# Patient Record
Sex: Male | Born: 1955 | Race: Black or African American | Hispanic: No | Marital: Married | Smoking: Former smoker
Health system: Southern US, Community
[De-identification: ages and names within clinical notes are randomized; demographics above are authoritative.]

## PROBLEM LIST (undated history)

## (undated) DIAGNOSIS — R6882 Decreased libido: Secondary | ICD-10-CM

## (undated) DIAGNOSIS — N529 Male erectile dysfunction, unspecified: Secondary | ICD-10-CM

## (undated) DIAGNOSIS — R42 Dizziness and giddiness: Secondary | ICD-10-CM

## (undated) DIAGNOSIS — I10 Essential (primary) hypertension: Secondary | ICD-10-CM

## (undated) DIAGNOSIS — H8309 Labyrinthitis, unspecified ear: Secondary | ICD-10-CM

## (undated) DIAGNOSIS — E785 Hyperlipidemia, unspecified: Secondary | ICD-10-CM

## (undated) DIAGNOSIS — F4310 Post-traumatic stress disorder, unspecified: Secondary | ICD-10-CM

## (undated) DIAGNOSIS — Z8711 Personal history of peptic ulcer disease: Secondary | ICD-10-CM

## (undated) DIAGNOSIS — N400 Enlarged prostate without lower urinary tract symptoms: Secondary | ICD-10-CM

## (undated) DIAGNOSIS — M1990 Unspecified osteoarthritis, unspecified site: Secondary | ICD-10-CM

## (undated) DIAGNOSIS — R1013 Epigastric pain: Secondary | ICD-10-CM

## (undated) HISTORY — DX: Dizziness and giddiness: R42

## (undated) HISTORY — DX: Labyrinthitis, unspecified ear: H83.09

## (undated) HISTORY — DX: Epigastric pain: R10.13

## (undated) HISTORY — DX: Personal history of peptic ulcer disease: Z87.11

## (undated) HISTORY — DX: Hyperlipidemia, unspecified: E78.5

## (undated) HISTORY — PX: COLONOSCOPY: SHX174

## (undated) HISTORY — PX: TOTAL KNEE ARTHROPLASTY: SHX125

## (undated) HISTORY — DX: Essential (primary) hypertension: I10

## (undated) HISTORY — PX: CYSTECTOMY: SUR359

## (undated) HISTORY — DX: Decreased libido: R68.82

## (undated) HISTORY — PX: KNEE ARTHROSCOPY: SHX127

## (undated) HISTORY — PX: APPENDECTOMY: SHX54

---

## 1998-10-04 ENCOUNTER — Emergency Department (HOSPITAL_COMMUNITY): Admission: EM | Admit: 1998-10-04 | Discharge: 1998-10-04 | Payer: Self-pay | Admitting: *Deleted

## 2006-06-21 ENCOUNTER — Ambulatory Visit: Payer: Self-pay | Admitting: Internal Medicine

## 2006-06-21 LAB — CONVERTED CEMR LAB
Basophils Absolute: 0 10*3/uL (ref 0.0–0.1)
Basophils Relative: 0.5 % (ref 0.0–1.0)
Bilirubin, Direct: 0.1 mg/dL (ref 0.0–0.3)
CO2: 33 meq/L — ABNORMAL HIGH (ref 19–32)
Creatinine, Ser: 1.1 mg/dL (ref 0.4–1.5)
Direct LDL: 189.6 mg/dL
Eosinophils Relative: 2.2 % (ref 0.0–5.0)
Glucose, Bld: 104 mg/dL — ABNORMAL HIGH (ref 70–99)
HCT: 43.3 % (ref 39.0–52.0)
Hemoglobin: 14.8 g/dL (ref 13.0–17.0)
MCHC: 34.3 g/dL (ref 30.0–36.0)
Monocytes Absolute: 0.6 10*3/uL (ref 0.2–0.7)
Neutrophils Relative %: 60 % (ref 43.0–77.0)
PSA: 0.59 ng/mL (ref 0.10–4.00)
Potassium: 4.2 meq/L (ref 3.5–5.1)
RDW: 13.7 % (ref 11.5–14.6)
Sodium: 141 meq/L (ref 135–145)
Total Bilirubin: 0.8 mg/dL (ref 0.3–1.2)
Total CHOL/HDL Ratio: 6.7
Total Protein: 7.1 g/dL (ref 6.0–8.3)
WBC: 7.4 10*3/uL (ref 4.5–10.5)

## 2006-06-28 ENCOUNTER — Ambulatory Visit: Payer: Self-pay | Admitting: Internal Medicine

## 2006-09-28 ENCOUNTER — Ambulatory Visit: Payer: Self-pay | Admitting: Internal Medicine

## 2006-09-28 LAB — CONVERTED CEMR LAB
AST: 34 units/L (ref 0–37)
Albumin: 3.9 g/dL (ref 3.5–5.2)
Alkaline Phosphatase: 98 units/L (ref 39–117)
Total Bilirubin: 1 mg/dL (ref 0.3–1.2)
VLDL: 17 mg/dL (ref 0–40)

## 2007-06-11 DIAGNOSIS — E785 Hyperlipidemia, unspecified: Secondary | ICD-10-CM

## 2007-06-11 DIAGNOSIS — I10 Essential (primary) hypertension: Secondary | ICD-10-CM

## 2007-06-11 HISTORY — DX: Hyperlipidemia, unspecified: E78.5

## 2007-06-11 HISTORY — DX: Essential (primary) hypertension: I10

## 2007-08-27 ENCOUNTER — Emergency Department (HOSPITAL_COMMUNITY): Admission: EM | Admit: 2007-08-27 | Discharge: 2007-08-27 | Payer: Self-pay | Admitting: Emergency Medicine

## 2007-08-29 ENCOUNTER — Ambulatory Visit: Payer: Self-pay | Admitting: Internal Medicine

## 2007-08-29 DIAGNOSIS — R6882 Decreased libido: Secondary | ICD-10-CM

## 2007-08-29 DIAGNOSIS — L723 Sebaceous cyst: Secondary | ICD-10-CM | POA: Insufficient documentation

## 2007-08-29 HISTORY — DX: Decreased libido: R68.82

## 2007-11-06 ENCOUNTER — Ambulatory Visit: Payer: Self-pay | Admitting: Internal Medicine

## 2007-11-06 LAB — CONVERTED CEMR LAB
Bilirubin, Direct: 0.1 mg/dL (ref 0.0–0.3)
Direct LDL: 184.4 mg/dL
HDL: 43.2 mg/dL (ref >39.0)
Total Bilirubin: 0.8 mg/dL (ref 0.3–1.2)
Total Protein: 6.9 g/dL (ref 6.0–8.3)
Triglycerides: 64 mg/dL (ref 0–149)
VLDL: 13 mg/dL (ref 0–40)

## 2007-11-19 ENCOUNTER — Ambulatory Visit: Payer: Self-pay | Admitting: Gastroenterology

## 2007-11-29 ENCOUNTER — Ambulatory Visit: Payer: Self-pay | Admitting: Internal Medicine

## 2007-11-29 LAB — CONVERTED CEMR LAB
Cholesterol, target level: 200 mg/dL
LDL Goal: 130 mg/dL

## 2007-12-03 ENCOUNTER — Ambulatory Visit: Payer: Self-pay | Admitting: Gastroenterology

## 2008-02-09 ENCOUNTER — Ambulatory Visit: Payer: Self-pay | Admitting: Cardiovascular Disease

## 2008-02-10 ENCOUNTER — Ambulatory Visit: Payer: Self-pay | Admitting: Internal Medicine

## 2008-02-10 ENCOUNTER — Ambulatory Visit: Payer: Self-pay | Admitting: Cardiology

## 2008-02-10 ENCOUNTER — Observation Stay (HOSPITAL_COMMUNITY): Admission: EM | Admit: 2008-02-10 | Discharge: 2008-02-11 | Payer: Self-pay | Admitting: Emergency Medicine

## 2008-02-11 ENCOUNTER — Encounter: Payer: Self-pay | Admitting: Internal Medicine

## 2008-02-12 ENCOUNTER — Ambulatory Visit: Payer: Self-pay

## 2008-02-12 ENCOUNTER — Encounter: Payer: Self-pay | Admitting: Internal Medicine

## 2008-02-13 ENCOUNTER — Telehealth: Payer: Self-pay | Admitting: *Deleted

## 2008-02-15 ENCOUNTER — Telehealth: Payer: Self-pay | Admitting: Internal Medicine

## 2008-02-15 ENCOUNTER — Ambulatory Visit: Payer: Self-pay | Admitting: Internal Medicine

## 2008-02-15 DIAGNOSIS — H8309 Labyrinthitis, unspecified ear: Secondary | ICD-10-CM | POA: Insufficient documentation

## 2008-02-15 DIAGNOSIS — R1013 Epigastric pain: Secondary | ICD-10-CM | POA: Insufficient documentation

## 2008-02-15 HISTORY — DX: Labyrinthitis, unspecified ear: H83.09

## 2008-02-15 HISTORY — DX: Epigastric pain: R10.13

## 2008-02-15 LAB — CONVERTED CEMR LAB
Basophils Absolute: 0 10*3/uL (ref 0.0–0.1)
Basophils Relative: 0.4 % (ref 0.0–3.0)
Calcium: 9.7 mg/dL (ref 8.4–10.5)
Eosinophils Absolute: 0 10*3/uL (ref 0.0–0.7)
Folate: 15.4 ng/mL
GFR calc Af Amer: 91 mL/min
GFR calc non Af Amer: 75 mL/min
Glucose, Bld: 96 mg/dL (ref 70–99)
Iron: 47 ug/dL (ref 42–165)
Lymphocytes Relative: 19.4 % (ref 12.0–46.0)
MCHC: 34.7 g/dL (ref 30.0–36.0)
MCV: 78.1 fL (ref 78.0–100.0)
Neutrophils Relative %: 72.4 % (ref 43.0–77.0)
Platelets: 198 10*3/uL (ref 150–400)
Potassium: 4.4 meq/L (ref 3.5–5.1)
RBC: 6.03 M/uL — ABNORMAL HIGH (ref 4.22–5.81)
Saturation Ratios: 14.8 % — ABNORMAL LOW (ref 20.0–50.0)
Sodium: 141 meq/L (ref 135–145)
Transferrin: 227.2 mg/dL (ref >=212.0)
Vitamin B-12: 743 pg/mL (ref 211–911)
WBC: 10.6 10*3/uL — ABNORMAL HIGH (ref 4.5–10.5)

## 2008-02-21 ENCOUNTER — Encounter: Payer: Self-pay | Admitting: Internal Medicine

## 2008-03-12 ENCOUNTER — Ambulatory Visit: Payer: Self-pay | Admitting: Internal Medicine

## 2008-03-12 LAB — CONVERTED CEMR LAB
ALT: 47 units/L (ref 0–53)
Albumin: 3.8 g/dL (ref 3.5–5.2)
Alkaline Phosphatase: 88 units/L (ref 39–117)
BUN: 10 mg/dL (ref 6–23)
Bilirubin, Direct: 0.1 mg/dL (ref 0.0–0.3)
CO2: 31 meq/L (ref 19–32)
Calcium: 9.1 mg/dL (ref 8.4–10.5)
Eosinophils Relative: 1 % (ref 0.0–5.0)
Glucose, Bld: 107 mg/dL — ABNORMAL HIGH (ref 70–99)
HCT: 42.9 % (ref 39.0–52.0)
Hemoglobin: 14.2 g/dL (ref 13.0–17.0)
Ketones, urine, test strip: NEGATIVE
LDL Cholesterol: 112 mg/dL — ABNORMAL HIGH (ref 0–99)
Lymphocytes Relative: 25.7 % (ref 12.0–46.0)
Monocytes Relative: 9.1 % (ref 3.0–12.0)
Neutro Abs: 4.4 10*3/uL (ref 1.4–7.7)
Nitrite: NEGATIVE
Potassium: 4.2 meq/L (ref 3.5–5.1)
RDW: 13.7 % (ref 11.5–14.6)
Sodium: 143 meq/L (ref 135–145)
Specific Gravity, Urine: 1.02
Total CHOL/HDL Ratio: 5.2
Total Protein: 7.1 g/dL (ref 6.0–8.3)
Triglycerides: 84 mg/dL (ref 0–149)
Urobilinogen, UA: 0.2
WBC Urine, dipstick: NEGATIVE
WBC: 6.9 10*3/uL (ref 4.5–10.5)

## 2008-03-19 ENCOUNTER — Ambulatory Visit: Payer: Self-pay | Admitting: Internal Medicine

## 2008-03-31 ENCOUNTER — Telehealth: Payer: Self-pay | Admitting: Family Medicine

## 2008-04-01 ENCOUNTER — Ambulatory Visit: Payer: Self-pay | Admitting: Family Medicine

## 2008-04-01 DIAGNOSIS — Z8711 Personal history of peptic ulcer disease: Secondary | ICD-10-CM

## 2008-04-01 HISTORY — DX: Personal history of peptic ulcer disease: Z87.11

## 2008-04-15 ENCOUNTER — Telehealth: Payer: Self-pay | Admitting: Internal Medicine

## 2008-05-28 ENCOUNTER — Telehealth: Payer: Self-pay | Admitting: Internal Medicine

## 2008-06-12 ENCOUNTER — Ambulatory Visit: Payer: Self-pay | Admitting: Internal Medicine

## 2008-06-25 ENCOUNTER — Ambulatory Visit: Payer: Self-pay | Admitting: Internal Medicine

## 2008-06-25 ENCOUNTER — Telehealth: Payer: Self-pay | Admitting: Internal Medicine

## 2008-06-26 ENCOUNTER — Telehealth: Payer: Self-pay | Admitting: Internal Medicine

## 2008-06-27 ENCOUNTER — Telehealth: Payer: Self-pay | Admitting: Internal Medicine

## 2008-08-08 ENCOUNTER — Ambulatory Visit: Payer: Self-pay | Admitting: Internal Medicine

## 2008-08-08 LAB — CONVERTED CEMR LAB
Bilirubin, Direct: 0.1 mg/dL (ref 0.0–0.3)
LDL Cholesterol: 108 mg/dL — ABNORMAL HIGH (ref 0–99)
Total Bilirubin: 1 mg/dL (ref 0.3–1.2)
VLDL: 16.8 mg/dL (ref 0.0–40.0)

## 2008-08-15 ENCOUNTER — Ambulatory Visit: Payer: Self-pay | Admitting: Internal Medicine

## 2008-08-18 ENCOUNTER — Telehealth: Payer: Self-pay | Admitting: Internal Medicine

## 2008-08-19 ENCOUNTER — Ambulatory Visit: Payer: Self-pay | Admitting: Internal Medicine

## 2008-08-19 DIAGNOSIS — R42 Dizziness and giddiness: Secondary | ICD-10-CM

## 2008-08-19 HISTORY — DX: Dizziness and giddiness: R42

## 2008-08-19 LAB — CONVERTED CEMR LAB
BUN: 13 mg/dL (ref 6–23)
CO2: 30 meq/L (ref 19–32)
Chloride: 108 meq/L (ref 96–112)
Eosinophils Absolute: 0 10*3/uL (ref 0.0–0.7)
Eosinophils Relative: 0.7 % (ref 0.0–5.0)
Glucose, Bld: 93 mg/dL (ref 70–99)
HCT: 45.3 % (ref 39.0–52.0)
Lymphs Abs: 1.7 10*3/uL (ref 0.7–4.0)
MCHC: 33.8 g/dL (ref 30.0–36.0)
MCV: 76.7 fL — ABNORMAL LOW (ref 78.0–100.0)
Monocytes Absolute: 0.6 10*3/uL (ref 0.1–1.0)
Platelets: 149 10*3/uL — ABNORMAL LOW (ref 150.0–400.0)
Potassium: 3.9 meq/L (ref 3.5–5.1)
RDW: 14.3 % (ref 11.5–14.6)
WBC: 6.8 10*3/uL (ref 4.5–10.5)

## 2008-08-20 ENCOUNTER — Encounter: Admission: RE | Admit: 2008-08-20 | Discharge: 2008-08-20 | Payer: Self-pay | Admitting: Internal Medicine

## 2008-08-25 ENCOUNTER — Telehealth: Payer: Self-pay | Admitting: Internal Medicine

## 2008-09-09 ENCOUNTER — Encounter: Payer: Self-pay | Admitting: Internal Medicine

## 2008-09-09 ENCOUNTER — Telehealth: Payer: Self-pay | Admitting: Internal Medicine

## 2008-10-10 ENCOUNTER — Ambulatory Visit: Payer: Self-pay | Admitting: Internal Medicine

## 2010-02-24 ENCOUNTER — Ambulatory Visit: Payer: Self-pay | Admitting: Family Medicine

## 2010-04-29 ENCOUNTER — Encounter: Payer: Self-pay | Admitting: *Deleted

## 2010-05-06 NOTE — Assessment & Plan Note (Signed)
Summary: congestion//ccm (dr Lovell Sheehan' pt)   Vital Signs:  Patient profile:   55 year old male Weight:      237 pounds Temp:     98.2 degrees F oral BP sitting:   112 / 74  (right arm) Cuff size:   regular  Vitals Entered By: Duard Brady LPN (February 24, 2010 3:09 PM) CC: C/O CHEST CONGESTION - OCC SLIGHTLY PRODUCTIVE COUGH Is Patient Diabetic? No   History of Present Illness: Patient is a nonsmoker seen with approximately one-week history of cough which is mostly dry. Occasionally productive of yellow sputum. No chronic lung disease. No fever. Minimal nasal congestive symptoms. Cough more bothersome at night. Not relieved with over-the-counter cough medicine. No appetite or weight changes. No hemoptysis. No pleuritic pain.  Allergies (verified): No Known Drug Allergies  Past History:  Past Medical History: Last updated: 11/06/2007 Hyperlipidemia Hypertension ED  Physical Exam  General:  Well-developed,well-nourished,in no acute distress; alert,appropriate and cooperative throughout examination Ears:  External ear exam shows no significant lesions or deformities.  Otoscopic examination reveals clear canals, tympanic membranes are intact bilaterally without bulging, retraction, inflammation or discharge. Hearing is grossly normal bilaterally. Mouth:  Oral mucosa and oropharynx without lesions or exudates.  Teeth in good repair. Neck:  No deformities, masses, or tenderness noted. Lungs:  Normal respiratory effort, chest expands symmetrically. Lungs are clear to auscultation, no crackles or wheezes. Heart:  normal rate, regular rhythm, and no murmur.     Impression & Recommendations:  Problem # 1:  ACUTE BRONCHITIS (ICD-466.0)  Suspect viral. Cough suppressant prescribed. No indication for antibiotics at this time.  His updated medication list for this problem includes:    Hydrocodone-homatropine 5-1.5 Mg/32ml Syrp (Hydrocodone-homatropine) ..... One tsp by mouth q  4-6 hours as needed cough  Complete Medication List: 1)  Multivitamins Caps (Multiple vitamin) .... Once daily 2)  Adult Aspirin Ec Low Strength 81 Mg Tbec (Aspirin) .... Once daily 3)  Antivert 25 Mg Tabs (Meclizine hcl) .... One by mouth three times a day as needed for dizzyness 4)  Cvs Omeprazole 20 Mg Tbec (Omeprazole) .Marland Kitchen.. 1 once daily 5)  Crestor 10 Mg Tabs (Rosuvastatin calcium) .... One by mouth daily 6)  Exforge Hct 10-320-25 Mg Tabs (Amlodipine-valsartan-hctz) .... 1/2 by mouth daily 7)  Hydrocodone-homatropine 5-1.5 Mg/70ml Syrp (Hydrocodone-homatropine) .... One tsp by mouth q 4-6 hours as needed cough  Patient Instructions: 1)  Acute Bronchitis symptoms for less then 10 days are not  helped by antibiotics. Take over the counter cough medications. Call if no improvement in 5-7 days, sooner if increasing cough, fever, or new symptoms ( shortness of breath, chest pain) .  Prescriptions: HYDROCODONE-HOMATROPINE 5-1.5 MG/5ML SYRP (HYDROCODONE-HOMATROPINE) one tsp by mouth q 4-6 hours as needed cough  #120 ml x 0   Entered and Authorized by:   Evelena Peat MD   Signed by:   Evelena Peat MD on 02/24/2010   Method used:   Print then Give to Patient   RxID:   [id number]    Orders Added: 1)  Est. Patient Level III [42706]

## 2010-05-06 NOTE — Progress Notes (Signed)
Summary: elevated BP  Phone Note Call from Patient   Caller: Patient Call For: Stacie Glaze MD Summary of Call: Pt is taking Benicar 20 mg one by mouth daily, but BP has increased to 145/101.  What do you want him to do? [phone] Initial call taken by: Lynann Beaver CMA,  June 25, 2008 9:43 AM  Follow-up for Phone Call        dr Lovell Sheehan to see after pt walked ine Follow-up by: Willy Eddy, LPN,  June 25, 2008 11:21 AM

## 2010-05-10 ENCOUNTER — Encounter: Payer: Self-pay | Admitting: Internal Medicine

## 2010-05-10 ENCOUNTER — Ambulatory Visit (INDEPENDENT_AMBULATORY_CARE_PROVIDER_SITE_OTHER): Payer: BC Managed Care – PPO | Admitting: Internal Medicine

## 2010-05-10 DIAGNOSIS — Z8711 Personal history of peptic ulcer disease: Secondary | ICD-10-CM

## 2010-05-10 DIAGNOSIS — K219 Gastro-esophageal reflux disease without esophagitis: Secondary | ICD-10-CM | POA: Insufficient documentation

## 2010-05-10 DIAGNOSIS — E785 Hyperlipidemia, unspecified: Secondary | ICD-10-CM

## 2010-05-10 DIAGNOSIS — R6882 Decreased libido: Secondary | ICD-10-CM

## 2010-05-10 MED ORDER — ESOMEPRAZOLE MAGNESIUM 40 MG PO CPDR: 40.0000 mg | DELAYED_RELEASE_CAPSULE | Freq: Every day | ORAL | Status: DC

## 2010-05-10 NOTE — Progress Notes (Signed)
  Subjective:    Patient ID: Adam Randolph, male    DOB: 08-23-1955, 55 y.o.   MRN: [id number]  Cough This is a chronic problem. The current episode started more than 1 month ago. The problem has been gradually worsening. The problem occurs every few hours. The cough is non-productive. Associated symptoms include chills, ear congestion, ear pain, a fever, heartburn, myalgias, nasal congestion, postnasal drip, a rash, rhinorrhea, a sore throat, weight loss and wheezing. Pertinent negatives include no chest pain, headaches, shortness of breath or sweats. The symptoms are aggravated by lying down. He has tried nothing for the symptoms. There is no history of asthma, bronchiectasis, bronchitis, COPD, emphysema, environmental allergies or pneumonia.  Hypertension This is a chronic problem. The current episode started more than 1 year ago. The problem has been resolved since onset. The problem is controlled. Pertinent negatives include no chest pain, headaches, shortness of breath or sweats.  Hyperlipidemia This is a chronic problem. The current episode started more than 1 year ago. The problem is uncontrolled. Recent lipid tests were reviewed and are high. Associated symptoms include myalgias. Pertinent negatives include no chest pain or shortness of breath. He is currently on no antihyperlipidemic treatment. The current treatment provides no improvement of lipids. Compliance problems include adherence to diet and adherence to exercise.  Risk factors for coronary artery disease include male sex, dyslipidemia and family history.      Review of Systems  Constitutional: Positive for fever, chills and weight loss.  HENT: Positive for ear pain, sore throat, rhinorrhea and postnasal drip.   Respiratory: Positive for cough and wheezing. Negative for shortness of breath.   Cardiovascular: Negative for chest pain.  Gastrointestinal: Positive for heartburn.  Musculoskeletal: Positive for myalgias.  Skin:  Positive for rash.  Neurological: Negative for headaches.  Hematological: Negative for environmental allergies.       Objective:   Physical Exam  Constitutional: He is oriented to person, place, and time. He appears well-developed and well-nourished.  HENT:  Head: Normocephalic and atraumatic.  Right Ear: External ear normal.  Left Ear: External ear normal.  Eyes: EOM are normal. Pupils are equal, round, and reactive to light.  Neck: Normal range of motion. Neck supple.  Cardiovascular: Normal rate and regular rhythm.   Pulmonary/Chest: Effort normal and breath sounds normal.  Abdominal: Soft. Bowel sounds are normal.  Musculoskeletal: Normal range of motion.  Neurological: He is alert and oriented to person, place, and time.  Skin: Skin is warm and dry.        The patient's past medical history social history family history medication list and problem list reviewed as well as the patient's allergies   Assessment & Plan:   one. Chronic cough the patient's chronic cough is most likely due to GERD although other etiologies to consider maybe allergies or perhaps mild asthma. His risk factors for these disease however are much less than his known history of GERD and ulcer disease.  Therefore we will begin Nexium 40 mg one by mouth daily as a trial until his next visit.   2. Hypertension his hypertension has resolved with weight loss he is off medications is normal tensive encouraged him to continue weight loss and exercise.    3. Hyperlipidemia the patient is noncompliant with his medications we will schedule a complete physical examination and lipid monitoring to see how weight loss is impacted his hyperlipidemia.   we will plan to see this patient back for complete physical examination

## 2010-05-10 NOTE — Patient Instructions (Signed)
Arrick, be sure to take the Nexium on a daily basis a prescription has been called into the [facility].  Please schedule complete physical examination

## 2010-05-13 ENCOUNTER — Ambulatory Visit (INDEPENDENT_AMBULATORY_CARE_PROVIDER_SITE_OTHER): Payer: BC Managed Care – PPO | Admitting: Internal Medicine

## 2010-05-13 ENCOUNTER — Encounter: Payer: Self-pay | Admitting: Internal Medicine

## 2010-05-13 VITALS — BP 160/100 | HR 68 | Temp 98.1°F | Resp 14 | Ht 72.0 in | Wt 234.0 lb

## 2010-05-13 DIAGNOSIS — E785 Hyperlipidemia, unspecified: Secondary | ICD-10-CM

## 2010-05-13 DIAGNOSIS — I10 Essential (primary) hypertension: Secondary | ICD-10-CM

## 2010-05-13 DIAGNOSIS — Z0000 Encounter for general adult medical examination without abnormal findings: Secondary | ICD-10-CM

## 2010-05-13 LAB — POCT URINALYSIS DIPSTICK
Leukocytes, UA: NEGATIVE
Nitrite, UA: NEGATIVE
Protein, UA: NEGATIVE
pH, UA: 5

## 2010-05-13 LAB — CBC WITH DIFFERENTIAL/PLATELET
Eosinophils Absolute: 0.1 10*3/uL (ref 0.0–0.7)
Eosinophils Relative: 1.1 % (ref 0.0–5.0)
MCV: 78.7 fl (ref 78.0–100.0)
Monocytes Absolute: 0.7 10*3/uL (ref 0.1–1.0)
Neutrophils Relative %: 57.5 % (ref 43.0–77.0)
Platelets: 159 10*3/uL (ref 150.0–400.0)
WBC: 8.2 10*3/uL (ref 4.5–10.5)

## 2010-05-13 LAB — LIPID PANEL
Cholesterol: 241 mg/dL — ABNORMAL HIGH (ref 0–200)
HDL: 42.9 mg/dL (ref >39.00)
VLDL: 18.6 mg/dL (ref 0.0–40.0)

## 2010-05-13 LAB — BASIC METABOLIC PANEL
GFR: 100.5 mL/min (ref >60.00)
Glucose, Bld: 78 mg/dL (ref 70–99)
Potassium: 3.7 mEq/L (ref 3.5–5.1)
Sodium: 134 mEq/L — ABNORMAL LOW (ref 135–145)

## 2010-05-13 LAB — PSA: PSA: 0.61 ng/mL (ref 0.10–4.00)

## 2010-05-13 LAB — HEPATIC FUNCTION PANEL
Albumin: 3.9 g/dL (ref 3.5–5.2)
Total Protein: 7.1 g/dL (ref 6.0–8.3)

## 2010-05-13 LAB — TSH: TSH: 1.52 u[IU]/mL (ref 0.35–5.50)

## 2010-05-13 MED ORDER — OLMESARTAN MEDOXOMIL 20 MG PO TABS: 20.0000 mg | ORAL_TABLET | Freq: Every day | ORAL | Status: DC

## 2010-05-13 NOTE — Progress Notes (Signed)
Subjective:    Patient ID: Adam Randolph, male    DOB: [birth year], 55 y.o.   MRN: [id number]  HPI patient presents for complete physical examination in followup of elevated blood pressure he was previously diagnosed with hypertension but discontinued his medications do to attempt on his part to lose weight and modify salt intake.  He has done this but his blood pressure remains elevated I suspect he is very salt sensitive and that yesterday he had an order of french fries.   He did not have any chest pain shortness of breath or any cardiovascular warning signs he is an [place] male with elevated lipids and age greater than 50 and these risk factors alone would warrant intervention and his blood pressure.      Review of Systems  Constitutional: Negative for fever and fatigue.  HENT: Negative for hearing loss, congestion, neck pain and postnasal drip.   Eyes: Negative for discharge, redness and visual disturbance.  Respiratory: Negative for cough, shortness of breath and wheezing.   Cardiovascular: Negative for leg swelling.  Gastrointestinal: Negative for abdominal pain, constipation and abdominal distention.  Genitourinary: Negative for urgency and frequency.  Musculoskeletal: Negative for joint swelling and arthralgias.  Skin: Negative for color change and rash.  Neurological: Negative for weakness and light-headedness.  Hematological: Negative for adenopathy.  Psychiatric/Behavioral: Negative for behavioral problems.       Objective:   Physical Exam  Constitutional: He is oriented to person, place, and time. He appears well-developed and well-nourished.        Moderately overweight African American male in no apparent distress  HENT:  Head: Normocephalic and atraumatic.  Eyes: Conjunctivae are normal. Pupils are equal, round, and reactive to light.  Neck: Normal range of motion. Neck supple.  Cardiovascular: Normal rate and regular rhythm.   Pulmonary/Chest:  Effort normal and breath sounds normal.  Abdominal: Soft. Bowel sounds are normal.  Genitourinary: Rectum normal.         prostate was +2 in size with normal architecture and consistency  Musculoskeletal: Normal range of motion. He exhibits no edema and no tenderness.  Neurological: He is oriented to person, place, and time. He has normal reflexes. He displays normal reflexes. No cranial nerve deficit. He exhibits normal muscle tone. Coordination normal.  Skin: Skin is warm and dry.  Psychiatric: He has a normal mood and affect. Judgment and thought content normal.          Assessment & Plan:  1. Normal physical examination for this pleasant 55 year old white male screening labs were drawn today to include a PSA and TSH the basic metabolic panel liver panel and a CBC with differential we will review these with this patient after the results are obtained.  He has a history of labile hypertension he has attempted a diet and exercise program which has resulted in some weight loss but his blood pressure remains elevated.   We reviewed diet and exercise medication compliance health maintenance issues with this patient. 2. Essential hypertension the patient's hypertension has not been able to be treated with diet and exercise despite his best efforts he remains with a labile blood pressure his blood pressure today was 160/100 in both arms we discussed resuming the medication Benicar 20 mg by mouth daily discussed the side effects of the medications and the warning signs should he not treat his hypertension given his risk factors of age male African American hyperlipidemia and now hypertension.   he Will agreed to continue to  take this medication samples of the medication were given and a prescription for Benicar 20 mg by mouth daily was sent to his pharmacy diet and exercise were again reinforced with this patient.  Hyperlipidemia.  Monitoring laboratory values will be obtained today to see if his  weight loss and dietary changes have resulted in controlling his lipids without medication given his advanced risk factors would start him on a statin if his cholesterol is above goal

## 2010-05-13 NOTE — Patient Instructions (Signed)
Stay on the Benicar 20 mg daily for your blood pressure. It is important that she stay on this medication to protect your heart and eyes and kidneys from the effects of elevated blood pressure even when you're under stress you were given samples of this medication and a prescription has been called in to your [facility] pharmacy

## 2010-05-13 NOTE — Assessment & Plan Note (Signed)
The patient has tried to control his blood pressure to diet exercise but he still has an elevated blood pressure that requires treatment especially considering his risk factors of being a 55 year old [place] American male with hyperlipidemia.

## 2010-08-12 ENCOUNTER — Encounter: Payer: Self-pay | Admitting: Internal Medicine

## 2010-08-12 ENCOUNTER — Ambulatory Visit (INDEPENDENT_AMBULATORY_CARE_PROVIDER_SITE_OTHER): Payer: BC Managed Care – PPO | Admitting: Internal Medicine

## 2010-08-12 VITALS — BP 142/80 | HR 68 | Temp 98.2°F | Resp 16 | Ht 70.0 in | Wt 240.0 lb

## 2010-08-12 DIAGNOSIS — K219 Gastro-esophageal reflux disease without esophagitis: Secondary | ICD-10-CM

## 2010-08-12 DIAGNOSIS — E785 Hyperlipidemia, unspecified: Secondary | ICD-10-CM

## 2010-08-12 DIAGNOSIS — I10 Essential (primary) hypertension: Secondary | ICD-10-CM

## 2010-08-12 DIAGNOSIS — N529 Male erectile dysfunction, unspecified: Secondary | ICD-10-CM

## 2010-08-12 LAB — LIPID PANEL
Cholesterol: 220 mg/dL — ABNORMAL HIGH (ref 0–200)
Total CHOL/HDL Ratio: 6

## 2010-08-12 LAB — BASIC METABOLIC PANEL
BUN: 16 mg/dL (ref 6–23)
Creatinine, Ser: 1.2 mg/dL (ref 0.4–1.5)
GFR: 85.45 mL/min (ref >60.00)

## 2010-08-12 MED ORDER — TADALAFIL 10 MG PO TABS: 10.0000 mg | ORAL_TABLET | ORAL | Status: DC | PRN

## 2010-08-12 NOTE — Progress Notes (Signed)
  Subjective:    Patient ID: Adam Randolph, male    DOB: 01/14/1956, 55 y.o.   MRN: [id number]  HPI Patient is a 55 year old African American male with hypertension and a history of chest pain felt to be secondary to gastroesophageal reflux he has had no recent chest pain and has been doing well he states that he has noticed some low blood pressures at which time he skipped his blood pressure medications but today his blood pressure is elevated at 142/80 after 15 minutes of rest initially it was elevated at 150/100.     Review of Systems  Constitutional: Negative for fever and fatigue.  HENT: Negative for hearing loss, congestion, neck pain and postnasal drip.   Eyes: Negative for discharge, redness and visual disturbance.  Respiratory: Negative for cough, shortness of breath and wheezing.   Cardiovascular: Negative for leg swelling.  Gastrointestinal: Negative for abdominal pain, constipation and abdominal distention.  Genitourinary: Negative for urgency and frequency.  Musculoskeletal: Negative for joint swelling and arthralgias.  Skin: Negative for color change and rash.  Neurological: Negative for weakness and light-headedness.  Hematological: Negative for adenopathy.  Psychiatric/Behavioral: Negative for behavioral problems.   Past Medical History  Diagnosis Date  . HYPERLIPIDEMIA 06/11/2007  . UNSPECIFIED LABYRINTHITIS 02/15/2008  . HYPERTENSION 06/11/2007  . DIZZINESS, CHRONIC 08/19/2008  . Abdominal pain, epigastric 02/15/2008  . Decreased libido 08/29/2007  . DUODENAL ULCER, HX OF 04/01/2008   Past Surgical History  Procedure Date  . Appendectomy   . Knee arthroscopy     right knee    reports that he has never smoked. He does not have any smokeless tobacco history on file. He reports that he does not drink alcohol or use illicit drugs. family history includes Cancer in his father and Hypertension in his father. No Known Allergies      Objective:   Physical Exam    Constitutional: He appears well-developed and well-nourished.  HENT:  Head: Normocephalic and atraumatic.  Eyes: Conjunctivae are normal. Pupils are equal, round, and reactive to light.  Neck: Normal range of motion. Neck supple.  Cardiovascular: Normal rate and regular rhythm.   Pulmonary/Chest: Effort normal and breath sounds normal.  Abdominal: Soft. Bowel sounds are normal.          Assessment & Plan:  Patient has hypertension well controlled we take him back on a regular basis compliance was urged and a 30 minute face-to-face discussion of hypertensive control and the essential nature of blood pressure medicines.  He will take his medicine on a daily basis and not skip pills.  His gastroesophageal reflux is well controlled he has no ulcer symptoms he is not on a PPI at this time.  His cholesterol requires monitoring to see if he needs any intervention at this time risk factors include age [place] male and hypertension

## 2010-08-17 NOTE — Consult Note (Signed)
NAME:  Adam Randolph, Adam Randolph NO.:  [id number]   MEDICAL RECORD NO.:  [id number]          PATIENT TYPE:  INP   LOCATION:  3731                         FACILITY:  [facility]   PHYSICIAN:  Adam Rotunda, MD, FACCDATE OF BIRTH:  1955/04/09   DATE OF CONSULTATION:  02/11/2008  DATE OF DISCHARGE:                                 CONSULTATION   PRIMARY CARE PHYSICIAN:  Dr. Lovell Sheehan.   CHIEF COMPLAINT:  Chest pain.   HISTORY OF PRESENT ILLNESS:  Adam Randolph is a 55 year old male with no  previous history of coronary artery disease.  Last week he had two  episodes of lightheadedness that was orthostatic in nature by  description.  Two days ago, he was walking in [place] when he had  sudden onset of a lightheaded feeling.  There was associated with  diaphoresis.  He became extremely weak and sat down.  As soon as he sat  down, he began vomiting, although he had not been previously nauseated.  The vomiting continued with dry heaves.  At that time he got  midepigastric pain that reached an 8 or 9/10.  He describes it as a  muscle cramp feeling.  He had shortness of breath as well as weakness  and dizziness and presyncope, but there was no fall or complete loss of  consciousness.  EMS was called and he was brought to the emergency room.  In the emergency room, he was difficult arouse and there was concern for  a CVA, but his CT was negative and he had no focal deficits on exam.   He was admitted and received IV fluids.  Since he has been admitted to  the hospital, he has had some recurrent episodes of dizzy, does but no  recurrent chest pain, nausea or vomiting.  His dizziness has been mild.  It is not clearly orthostatic in nature.  He has received IV fluids with  some improvement in his symptoms.   PAST MEDICAL HISTORY:  1. Hypertension.  2. Hyperlipidemia.  3. Family history of premature coronary artery disease.  4. Obesity with a body mass index of 31.   SURGICAL HISTORY:   Status post appendectomy, right knee arthroscopy.   ALLERGIES:  NO KNOWN DRUG ALLERGIES.   MEDICATIONS:  1. Aspirin 81 mg daily.  2. DVT Lovenox.  3. Protonix 40 mg daily.  4. Zocor 20 mg a day.  5. Prior to admission he was taking HCTZ 25 mg 1/2 tablet daily, but      that is on hold.   SOCIAL HISTORY:  Lives in [place] with his wife and works as a Chief Executive Officer.  He quit tobacco 25 years ago with approximately five pack  year history and denies alcohol or drug abuse.   FAMILY HISTORY:  Mother is alive at age 25 with a history of an MI at  age 68.  Father died in his 68s with prostate cancer but no heart  disease.  He does have one brother who died in his 42s of an MI.   REVIEW OF SYSTEMS:  He has had no recent illnesses,  fevers or chills.  He has had no GI issues.  He notices that he has had some gas recently,  but denies indigestion or heartburn.  There has been no hematemesis,  hemoptysis or melena.  He denies arthralgias.  He never had chest pain  before Saturday.  Full 14-point review of systems is otherwise negative.   PHYSICAL EXAMINATION:  VITAL SIGNS:  Temperature is 97.9, pulse 61,  respiratory rate 18, blood pressure 123/82, orthostatic vital signs were  negative with a blood pressure 137/92, heart rate 70 lying and 149/100,  heart rate 76 standing.  GENERAL:  He is a well-developed [place] male in no acute  distress.  HEENT:  Normal.  NECK:  There is no lymphadenopathy, thyromegaly, bruit or JVD noted.  CVA:  His heart is regular in rate and rhythm with an S1-S2 and no  significant murmur, rub or gallop is noted.  Distal pulses are intact  and no femoral bruits are appreciated.  LUNGS:  Clear to auscultation bilaterally.  SKIN:  No rashes or lesions are noted.  ABDOMEN:  Soft and nontender with active bowel sounds and no  hepatosplenomegaly by palpation.  EXTREMITIES:  There is no cyanosis, clubbing or edema noted.  MUSCULOSKELETAL:  There is  no joint deformity or effusions and no spine  or CVA tenderness.  NEUROLOGICAL:  He is alert and oriented.  Cranial nerves II-XII grossly  intact.   STUDIES:  Chest x-ray:  No acute disease.   Head CT normal.   EKG:  Sinus bradycardia rate 51 with no acute ischemic changes and no  pathologic Q-waves noted.   LABORATORY VALUES:  Hemoglobin 13.2, hematocrit 39.2, WBCs 8.2,  platelets 150.  Sodium 140, potassium 4.0, chloride 107, CO2 29, BUN 7,  creatinine 1.04, glucose 104, CK-MB 367/4.5, then 316/3.6 and 290/2.9,  troponin I 0.01 x3.   IMPRESSION:  Mr. Carcamo was seen today by Dr. Antoine Poche.  He had an  episode of presyncope associated with nausea Saturday.  This is  associated with vague chest discomfort.  His EKG with nonspecific T-wave  flattening in an RSR prime in V1.  His pretest probability of coronary  artery disease is moderate, but he could have an outpatient evaluation  with a stress Myoview and an echocardiogram to rule out LVH.  He will be  followed up as an outpatient, and a decision can be made on an event or  Holter monitor at that time.      Adam Demark, Adam Randolph      Adam Rotunda, MD, [facility]  Electronically Signed   RB/MEDQ  D:  02/11/2008  T:  02/11/2008  Job:  [id number]   cc:   Dr. Lovell Sheehan

## 2010-08-17 NOTE — H&P (Signed)
NAME:  EWALD, BEG              ACCOUNT NO.:  [id number]   MEDICAL RECORD NO.:  [id number]          PATIENT TYPE:  INP   LOCATION:  3731                         FACILITY:  [facility]   PHYSICIAN:  Kela Millin, M.D.DATE OF BIRTH:  [birth year]   DATE OF ADMISSION:  02/09/2008  DATE OF DISCHARGE:                              HISTORY & PHYSICAL   PRIMARY CARE PHYSICIAN:  Stacie Glaze, MD   CHIEF COMPLAINT:  Epigastric/? chest pain with dizziness.   HISTORY OF PRESENT ILLNESS:  The patient is a 55 year old black male  with past medical history significant for hypertension and  hyperlipidemia who presents with the above complaints.  He states that  the pain is really in his epigastrium and that he had the pain  intermittently all day which episode lasting about 4-5 minutes but it  worsened with exertion and he was walking around at [employer].  He  reports it was 10/10 in intensity at its worst impression, worse with  exertion as already mentioned and associated with diaphoresis, nausea  and several episodes of vomiting.  In the ER he was given aspirin and IV  Zofran and following this his symptoms resolved.  He states that he has  had dizziness/lightheadedness for the past 1 week.  He denies  hematemesis, melena, diarrhea or hematochezia, blurred vision and no  focal weakness, but he has had generalized weakness today.   He was seen in the emergency room and the EKG was negative for acute  ischemic findings and point of care markers negative.  A chest x-ray  showed no active lung disease and a CT scan of his head was normal.  He  is admitted for further evaluation and management.  He states he has  never had similar symptoms.   PAST MEDICAL HISTORY:  As above.   MEDICATIONS:  1. Hydrochlorothiazide 12.5 mg daily.  2. Zocor.   ALLERGIES:  NKDA.   SOCIAL HISTORY:  He denies alcohol, he quit tobacco about 25 years ago.   FAMILY HISTORY:  He had a brother that was diabetic,  died in his 58s of  an MI.  His mother had hypertension and an MI.   REVIEW OF SYSTEMS:  As per HPI, other review of systems negative.   PHYSICAL EXAMINATION:  GENERAL:  In general the patient is a middle-aged  black male.  He appears weak.  He is alert and oriented x3, in no  apparent distress.  VITAL SIGNS:  Temperature is 97.2 with a blood pressure of 121/57, pulse  is 77, respiratory rate is 20, O2 sat is 100%.  HEENT:  Slightly dry mucous membranes, PERRL, EOMI, sclerae anicteric.  No oral exudates.  NECK:  Supple, no adenopathy, no thyromegaly, no JVD and no carotid  bruits appreciated.  LUNGS:  Clear to auscultation bilaterally.  No crackles or wheezes.  CARDIOVASCULAR:  Regular rate and rhythm.  Normal S1 and S2.  ABDOMEN:  Soft, bowel sounds present, nontender, nondistended.  No  organomegaly and no masses palpable.  EXTREMITIES:  No cyanosis and no edema.  NEUROLOGICAL:  He is awake, oriented.  Cranial  nerves II-XII grossly  intact.  Strength 4-5/5 and symmetric, nonfocal exam.   LABORATORY DATA:  Sodium is 140 with a potassium of 3.4, chloride is  101, BUN is 14, creatinine is 1.3, glucose is 123, calcium is 1.17,  white cell count is 9.0 with a hemoglobin of 14.4, hematocrit is 43,  platelet count is 203, neutrophil count 59%.  Point of care markers  negative times one.  Chest x-ray and CT scan of head as per HPI.  Serum  lipase and hepatic panel pending.   ASSESSMENT AND PLAN:  1. Epigastric/? chest pain - as discussed above will obtain serial      cardiac enzymes, place on nitroglycerin, aspirin.  Will add a      proton pump inhibitor to cover for possible GI etiology.  Follow      and consult cardiology pending enzymes.  2. Hypertension - will hold HCTZ for now pending orthostatic vital      signs.  Follow and resume when appropriate.  3. Dizziness - as discussed above CT scan of head is negative.      Cardiac enzymes as above.  Obtain orthostatics.  Follow and  manage      accordingly.  4. Hypercholesterolemia - continue Zocor.  5. Mild hypokalemia - secondary to HCTZ, replace potassium.      Kela Millin, M.D.  Electronically Signed     ACV/MEDQ  D:  02/10/2008  T:  02/10/2008  Job:  [id number]   cc:   Stacie Glaze, MD

## 2011-01-04 LAB — BASIC METABOLIC PANEL
BUN: 7
Calcium: 8.6
Creatinine, Ser: 1.04
GFR calc non Af Amer: >60
Glucose, Bld: 104 — ABNORMAL HIGH

## 2011-01-04 LAB — CK TOTAL AND CKMB (NOT AT ARMC)
CK, MB: 2.9
Relative Index: 1
Relative Index: 1.1
Relative Index: 1.2
Total CK: 290 — ABNORMAL HIGH

## 2011-01-04 LAB — DIFFERENTIAL
Basophils Relative: 0
Lymphs Abs: 2.8
Monocytes Absolute: 0.8
Monocytes Relative: 9
Neutro Abs: 5.3

## 2011-01-04 LAB — HEPATIC FUNCTION PANEL
Alkaline Phosphatase: 82
Bilirubin, Direct: 0.1
Indirect Bilirubin: 0.7
Total Bilirubin: 0.8
Total Protein: 6.7

## 2011-01-04 LAB — CBC
Hemoglobin: 14.4
MCHC: 33.4
MCHC: 33.6
Platelets: 150
RBC: 5.48
RDW: 14.5
WBC: 9

## 2011-01-04 LAB — POCT I-STAT, CHEM 8
Calcium, Ion: 1.17
Chloride: 101
HCT: 43
Hemoglobin: 14.6

## 2011-01-04 LAB — D-DIMER, QUANTITATIVE: D-Dimer, Quant: 0.23

## 2011-01-04 LAB — TROPONIN I: Troponin I: <0.01

## 2011-01-04 LAB — LIPASE, BLOOD: Lipase: 22

## 2011-01-04 LAB — POCT CARDIAC MARKERS: Myoglobin, poc: 135

## 2011-01-20 ENCOUNTER — Telehealth: Payer: Self-pay | Admitting: Internal Medicine

## 2011-01-20 NOTE — Telephone Encounter (Signed)
Pt would like bloodwork results from 2-9 and 08-12-2010

## 2011-01-20 NOTE — Telephone Encounter (Signed)
Please let pt know results are up front-thanks

## 2011-01-20 NOTE — Telephone Encounter (Signed)
Pt. Notified.

## 2011-01-21 ENCOUNTER — Encounter: Payer: Self-pay | Admitting: Family Medicine

## 2011-01-21 ENCOUNTER — Ambulatory Visit (INDEPENDENT_AMBULATORY_CARE_PROVIDER_SITE_OTHER): Payer: BC Managed Care – PPO | Admitting: Family Medicine

## 2011-01-21 DIAGNOSIS — R0789 Other chest pain: Secondary | ICD-10-CM

## 2011-01-21 DIAGNOSIS — R071 Chest pain on breathing: Secondary | ICD-10-CM

## 2011-01-21 DIAGNOSIS — I10 Essential (primary) hypertension: Secondary | ICD-10-CM

## 2011-01-21 NOTE — Progress Notes (Signed)
  Subjective:    Patient ID: Adam Randolph, male    DOB: 03/28/1956, 55 y.o.   MRN: [id number]  HPI  Patient seen with right chest wall pain. Duration approximately one week. Exacerbated by coughing. Possibly strained at work. Does a lot of lifting at work. Location is right pectoral muscle region. Sore to touch. Denies any dyspnea. No pleuritic pain. Severity 5/10. Exacerbated by movement. Aleve and ibuprofen with mild relief. No abdominal pain, nausea, or vomiting. No skin rash.  Hypertension. Treated Benicar 20 mg daily but has not been taking. No history of any significant side effects. He still has samples at home. Denies any headaches or dizziness. No regular exercise. No alcohol use. Has history of GERD but no recent active symptoms. Currently not on PPI  Past Medical History  Diagnosis Date  . HYPERLIPIDEMIA 06/11/2007  . UNSPECIFIED LABYRINTHITIS 02/15/2008  . HYPERTENSION 06/11/2007  . DIZZINESS, CHRONIC 08/19/2008  . Abdominal pain, epigastric 02/15/2008  . Decreased libido 08/29/2007  . DUODENAL ULCER, HX OF 04/01/2008   Past Surgical History  Procedure Date  . Appendectomy   . Knee arthroscopy     right knee    reports that he has never smoked. He does not have any smokeless tobacco history on file. He reports that he does not drink alcohol or use illicit drugs. family history includes Cancer in his father and Hypertension in his father. No Known Allergies    Review of Systems  Constitutional: Negative for fever, chills and unexpected weight change.  Respiratory: Positive for cough. Negative for shortness of breath and wheezing.   Cardiovascular: Negative for palpitations and leg swelling.  Gastrointestinal: Negative for nausea and vomiting.  Skin: Negative for rash.  Neurological: Negative for dizziness, syncope, weakness and headaches.       Objective:   Physical Exam  Constitutional: He appears well-developed and well-nourished.  HENT:  Mouth/Throat:  Oropharynx is clear and moist.  Neck: Neck supple.  Cardiovascular: Normal rate and regular rhythm.   No murmur heard. Pulmonary/Chest: Effort normal and breath sounds normal. No respiratory distress. He has no wheezes. He has no rales.  Abdominal: Soft. He exhibits no mass. There is no tenderness.  Musculoskeletal:       Right chest wall tenderness. Full range of motion right shoulder.  Lymphadenopathy:    He has no cervical adenopathy.  Skin: No rash noted.          Assessment & Plan:  #1 right chest wall pain. Suspect musculoskeletal. Short-term use of Aleve or Advil. No concern for pulmonary etiology. #2 hypertension. Poorly controlled but patient currently off medication. Get back on Benicar 20 mg daily and continue close monitoring

## 2011-01-21 NOTE — Patient Instructions (Signed)
Get back on regular use of Benicar. Consider Aleve or Advil for chest wall soreness.

## 2011-04-27 ENCOUNTER — Telehealth: Payer: Self-pay | Admitting: *Deleted

## 2011-04-27 NOTE — Telephone Encounter (Signed)
Pt is asking for samples of Viagra. Cannot afford to buy it.

## 2011-04-28 ENCOUNTER — Telehealth: Payer: Self-pay | Admitting: *Deleted

## 2011-04-28 NOTE — Telephone Encounter (Signed)
Calling back for samples of Viagra.

## 2011-04-28 NOTE — Telephone Encounter (Signed)
NONE AVAILABLE 

## 2011-09-06 ENCOUNTER — Other Ambulatory Visit (INDEPENDENT_AMBULATORY_CARE_PROVIDER_SITE_OTHER): Payer: BC Managed Care – PPO

## 2011-09-06 DIAGNOSIS — Z0000 Encounter for general adult medical examination without abnormal findings: Secondary | ICD-10-CM

## 2011-09-06 LAB — HEPATIC FUNCTION PANEL
AST: 20 U/L (ref 0–37)
Albumin: 3.7 g/dL (ref 3.5–5.2)
Alkaline Phosphatase: 83 U/L (ref 39–117)
Bilirubin, Direct: 0.1 mg/dL (ref 0.0–0.3)

## 2011-09-06 LAB — CBC WITH DIFFERENTIAL/PLATELET
Basophils Absolute: 0 10*3/uL (ref 0.0–0.1)
Eosinophils Absolute: 0.1 10*3/uL (ref 0.0–0.7)
Hemoglobin: 14.5 g/dL (ref 13.0–17.0)
Lymphocytes Relative: 29.5 % (ref 12.0–46.0)
Monocytes Relative: 8.2 % (ref 3.0–12.0)
Neutro Abs: 4.7 10*3/uL (ref 1.4–7.7)
Neutrophils Relative %: 60.1 % (ref 43.0–77.0)
RBC: 5.54 Mil/uL (ref 4.22–5.81)
RDW: 15.1 % — ABNORMAL HIGH (ref 11.5–14.6)

## 2011-09-06 LAB — BASIC METABOLIC PANEL
Calcium: 8.8 mg/dL (ref 8.4–10.5)
Creatinine, Ser: 1.2 mg/dL (ref 0.4–1.5)
GFR: 83.43 mL/min (ref >60.00)
Sodium: 140 mEq/L (ref 135–145)

## 2011-09-06 LAB — POCT URINALYSIS DIPSTICK
Ketones, UA: NEGATIVE
Leukocytes, UA: NEGATIVE
Nitrite, UA: NEGATIVE
Protein, UA: NEGATIVE
Urobilinogen, UA: 0.2
pH, UA: 6

## 2011-09-06 LAB — PSA: PSA: 0.77 ng/mL (ref 0.10–4.00)

## 2011-09-06 LAB — LIPID PANEL
HDL: 40.2 mg/dL (ref >39.00)
Total CHOL/HDL Ratio: 6
Triglycerides: 143 mg/dL (ref 0.0–149.0)
VLDL: 28.6 mg/dL (ref 0.0–40.0)

## 2011-09-13 ENCOUNTER — Ambulatory Visit (INDEPENDENT_AMBULATORY_CARE_PROVIDER_SITE_OTHER): Payer: BC Managed Care – PPO | Admitting: Internal Medicine

## 2011-09-13 ENCOUNTER — Encounter: Payer: Self-pay | Admitting: Internal Medicine

## 2011-09-13 VITALS — BP 132/88 | HR 72 | Temp 98.0°F | Resp 16 | Ht 72.0 in | Wt 248.0 lb

## 2011-09-13 DIAGNOSIS — N529 Male erectile dysfunction, unspecified: Secondary | ICD-10-CM

## 2011-09-13 DIAGNOSIS — R030 Elevated blood-pressure reading, without diagnosis of hypertension: Secondary | ICD-10-CM

## 2011-09-13 DIAGNOSIS — Z0000 Encounter for general adult medical examination without abnormal findings: Secondary | ICD-10-CM

## 2011-09-13 DIAGNOSIS — E785 Hyperlipidemia, unspecified: Secondary | ICD-10-CM

## 2011-09-13 MED ORDER — SILDENAFIL CITRATE 100 MG PO TABS: 50.0000 mg | ORAL_TABLET | Freq: Every day | ORAL | Status: DC | PRN

## 2011-09-13 MED ORDER — RED YEAST RICE 600 MG PO CAPS: 1.0000 | ORAL_CAPSULE | Freq: Two times a day (BID) | ORAL | Status: DC

## 2011-09-13 NOTE — Patient Instructions (Signed)
The patient is instructed to continue all medications as prescribed. Schedule followup with check out clerk upon leaving the clinic [facility] 

## 2011-09-13 NOTE — Progress Notes (Signed)
Subjective:    Patient ID: Adam Randolph, male    DOB: April 28, 1955, 56 y.o.   MRN: [id number]  HPI patient is a 56 year old male followed for his annual examination today.  He has been diagnosed with hypertension but has been attempting to control it with diet and exercise and has been monitoring his blood pressure carefully with a home blood pressure cuff.  He takes the Benicar very rarely now and his blood pressure he believes has been controlled.  Today initial blood pressure was elevated but after he had 10 minutes of rest the blood pressure was 132/88 nightly chest pain abnormal shortness of breath PND orthopnea blood chemistries looked good on screening blood work hemoglobin and hematocrit as well as platelet counts were excellent his liver functions were completely normal his PSA was unchanged from prior PSAs the only problems that were identified in his screening blood work were his cholesterol which remains elevated at 228 with an HDL of 40 but his LDL is 159 with target LDL below 1:30    Review of Systems  Constitutional: Negative for fever and fatigue.  HENT: Negative for hearing loss, congestion, neck pain and postnasal drip.   Eyes: Negative for discharge, redness and visual disturbance.  Respiratory: Negative for cough, shortness of breath and wheezing.   Cardiovascular: Negative for leg swelling.  Gastrointestinal: Negative for abdominal pain, constipation and abdominal distention.  Genitourinary: Negative for urgency and frequency.  Musculoskeletal: Negative for joint swelling and arthralgias.  Skin: Negative for color change and rash.  Neurological: Negative for weakness and light-headedness.  Hematological: Negative for adenopathy.  Psychiatric/Behavioral: Negative for behavioral problems.   Past Medical History  Diagnosis Date  . HYPERLIPIDEMIA 06/11/2007  . UNSPECIFIED LABYRINTHITIS 02/15/2008  . HYPERTENSION 06/11/2007  . DIZZINESS, CHRONIC 08/19/2008  . Abdominal  pain, epigastric 02/15/2008  . Decreased libido 08/29/2007  . DUODENAL ULCER, HX OF 04/01/2008    History   Social History  . Marital Status: Married    Spouse Name: N/A    Number of Children: N/A  . 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 Smoking status: Never Smoker   . Smokeless tobacco: Not on file  . Alcohol Use: No  . Drug Use: No  . Sexually Active: Yes   Other Topics Concern  . Not on file   Social History Narrative  . No narrative on file    Past Surgical History  Procedure Date  . Appendectomy   . Knee arthroscopy     right knee    Family History  Problem Relation Age of Onset  . Hypertension Father   . Cancer Father     No Known Allergies  Current Outpatient Prescriptions on File Prior to Visit  Medication Sig Dispense Refill  . Multiple Vitamin (MULTIVITAMIN PO) Take by mouth.        . sildenafil (VIAGRA) 100 MG tablet Take 0.5-1 tablets (50-100 mg total) by mouth daily as needed for erectile dysfunction.  5 tablet  11  . tadalafil (CIALIS) 10 MG tablet Take 1 tablet (10 mg total) by mouth as needed for erectile dysfunction.  20 tablet  1    BP 132/88  Pulse 72  Temp 98 F (36.7 C)  Resp 16  Ht 6' (1.829 m)  Wt 248 lb (112.492 kg)  BMI 33.63 kg/m2       Objective:   Physical Exam  Nursing note and vitals  reviewed. Constitutional: He is oriented to person, place, and time. He appears well-developed and well-nourished.  HENT:  Head: Normocephalic and atraumatic.  Eyes: Conjunctivae are normal. Pupils are equal, round, and reactive to light.  Neck: Normal range of motion. Neck supple.  Cardiovascular: Normal rate and regular rhythm.   Pulmonary/Chest: Effort normal and breath sounds normal.  Abdominal: Soft. Bowel sounds are normal.  Genitourinary: Rectum normal and prostate normal.  Musculoskeletal: He exhibits no edema and no tenderness.       Arthritic changes today's    Neurological: He is alert and oriented to person, place, and time. He has normal reflexes.  Skin: Skin is warm and dry.  Psychiatric: He has a normal mood and affect. His behavior is normal.          Assessment & Plan:    Patient presents for yearly preventative medicine examination.   all immunizations and health maintenance protocols were reviewed with the patient and they are up to date with these protocols.   screening laboratory values were reviewed with the patient including screening of hyperlipidemia PSA renal function and hepatic function.   There medications past medical history social history problem list and allergies were reviewed in detail.   Goals were established with regard to weight loss exercise diet in compliance with medications  We'll change from Cialis to Viagra per the patient's desire.  We will add red rice yeast 600 mg capsules by mouth twice a day for mild to moderate hyperlipidemia.  We'll continue to observe blood pressure we'll change diagnosis of hypertension 2 elevated blood pressure without diagnosis of hypertension since she's been able to control with diet and exercise.  Followup in 4 months

## 2012-01-13 ENCOUNTER — Ambulatory Visit (INDEPENDENT_AMBULATORY_CARE_PROVIDER_SITE_OTHER): Payer: BC Managed Care – PPO | Admitting: Internal Medicine

## 2012-01-13 ENCOUNTER — Encounter: Payer: Self-pay | Admitting: Internal Medicine

## 2012-01-13 VITALS — BP 140/80 | HR 68 | Temp 98.3°F | Resp 16 | Ht 72.0 in | Wt 254.0 lb

## 2012-01-13 DIAGNOSIS — K219 Gastro-esophageal reflux disease without esophagitis: Secondary | ICD-10-CM

## 2012-01-13 DIAGNOSIS — I10 Essential (primary) hypertension: Secondary | ICD-10-CM

## 2012-01-13 DIAGNOSIS — E785 Hyperlipidemia, unspecified: Secondary | ICD-10-CM

## 2012-01-13 LAB — LDL CHOLESTEROL, DIRECT: Direct LDL: 191.2 mg/dL

## 2012-01-13 LAB — LIPID PANEL
Total CHOL/HDL Ratio: 6
Triglycerides: 89 mg/dL (ref 0.0–149.0)

## 2012-01-13 NOTE — Progress Notes (Signed)
Subjective:    Patient ID: Adam Randolph, male    DOB: [birth year], 56 y.o.   MRN: [id number]  HPI This is a 56 year old male presents for followup of hypertension hyperlipidemia and a history of gastroesophageal reflux.  Currently he is on no medication for blood pressure and try to control his blood pressure through diet and weight control. His blood pressure is borderline but the patient is continuing his efforts to control this to diet and exercise.   Patient is also trying to control his cholesterol through diet and using the red rice yeast capsules.      Review of Systems  Constitutional: Negative for fever and fatigue.  HENT: Negative for hearing loss, congestion, neck pain and postnasal drip.   Eyes: Negative for discharge, redness and visual disturbance.  Respiratory: Negative for cough, shortness of breath and wheezing.   Cardiovascular: Negative for leg swelling.  Gastrointestinal: Negative for abdominal pain, constipation and abdominal distention.  Genitourinary: Negative for urgency and frequency.  Musculoskeletal: Negative for joint swelling and arthralgias.  Skin: Negative for color change and rash.  Neurological: Negative for weakness and light-headedness.  Hematological: Negative for adenopathy.  Psychiatric/Behavioral: Negative for behavioral problems.   Past Medical History  Diagnosis Date  . HYPERLIPIDEMIA 06/11/2007  . UNSPECIFIED LABYRINTHITIS 02/15/2008  . HYPERTENSION 06/11/2007  . DIZZINESS, CHRONIC 08/19/2008  . Abdominal pain, epigastric 02/15/2008  . Decreased libido 08/29/2007  . DUODENAL ULCER, HX OF 04/01/2008    History   Social History  . Marital Status: Married    Spouse Name: N/A    Number of Children: N/A  . 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 Smoking status: Never Smoker   . Smokeless tobacco: Not on file  . Alcohol Use: No  . Drug Use: No  . Sexually Active: Yes     Other Topics Concern  . Not on file   Social History Narrative  . No narrative on file    Past Surgical History  Procedure Date  . Appendectomy   . Knee arthroscopy     right knee    Family History  Problem Relation Age of Onset  . Hypertension Father   . Cancer Father     No Known Allergies  Current Outpatient Prescriptions on File Prior to Visit  Medication Sig Dispense Refill  . Multiple Vitamin (MULTIVITAMIN PO) Take by mouth.        . Red Yeast Rice 600 MG CAPS Take 1 capsule (600 mg total) by mouth 2 (two) times daily.      Marland Kitchen DISCONTD: sildenafil (VIAGRA) 100 MG tablet Take 0.5-1 tablets (50-100 mg total) by mouth daily as needed for erectile dysfunction.  5 tablet  11  . DISCONTD: tadalafil (CIALIS) 10 MG tablet Take 1 tablet (10 mg total) by mouth as needed for erectile dysfunction.  20 tablet  1    BP 140/80  Pulse 68  Temp 98.3 F (36.8 C)  Resp 16  Ht 6' (1.829 m)  Wt 254 lb (115.214 kg)  BMI 34.45 kg/m2        Objective:   Physical Exam  Nursing note and vitals reviewed. Constitutional: He appears well-developed and well-nourished.  HENT:  Head: Normocephalic and atraumatic.  Eyes: Conjunctivae normal are normal. Pupils are equal, round, and reactive to light.  Neck: Normal range of motion. Neck supple.  Cardiovascular: Normal rate and regular rhythm.   Pulmonary/Chest: Effort  normal and breath sounds normal.  Abdominal: Soft. Bowel sounds are normal.          Assessment & Plan:  Patient is cleared for total knee replacement [facility] Patient's blood pressure is stable he continues to be control with diet Patient's lipids will be monitored today Due to principles of gluten-free diet

## 2012-01-13 NOTE — Patient Instructions (Signed)
Look over the "paleo diet"

## 2012-01-27 ENCOUNTER — Encounter: Payer: Self-pay | Admitting: Family Medicine

## 2012-01-27 ENCOUNTER — Telehealth: Payer: Self-pay | Admitting: Internal Medicine

## 2012-01-27 ENCOUNTER — Ambulatory Visit (INDEPENDENT_AMBULATORY_CARE_PROVIDER_SITE_OTHER): Payer: BC Managed Care – PPO | Admitting: Family Medicine

## 2012-01-27 VITALS — BP 138/100 | HR 81 | Temp 98.7°F | Wt 251.0 lb

## 2012-01-27 DIAGNOSIS — J069 Acute upper respiratory infection, unspecified: Secondary | ICD-10-CM

## 2012-01-27 DIAGNOSIS — I10 Essential (primary) hypertension: Secondary | ICD-10-CM

## 2012-01-27 MED ORDER — SULFACETAMIDE SODIUM 10 % OP SOLN: 1.0000 [drp] | OPHTHALMIC | Status: DC

## 2012-01-27 MED ORDER — BENZONATATE 100 MG PO CAPS: 100.0000 mg | ORAL_CAPSULE | Freq: Three times a day (TID) | ORAL | Status: DC | PRN

## 2012-01-27 NOTE — Progress Notes (Addendum)
Chief Complaint  Patient presents with  . URI    cough, eyes red and swollen     HPI: -started: 6 days ago -symptoms:nasal congestion, sore throat, cough, sinus pressure, watery and red eyes - irritated -denies:fever, SOB, NVD, tooth pain, strep or mono exposure -has tried: tylenol cold and other OTC medications -sick contacts: wife last week -Hx of: asthma, allergies    ROS: See pertinent positives and negatives per HPI.  Past Medical History  Diagnosis Date  . HYPERLIPIDEMIA 06/11/2007  . UNSPECIFIED LABYRINTHITIS 02/15/2008  . HYPERTENSION 06/11/2007  . DIZZINESS, CHRONIC 08/19/2008  . Abdominal pain, epigastric 02/15/2008  . Decreased libido 08/29/2007  . DUODENAL ULCER, HX OF 04/01/2008    Family History  Problem Relation Age of Onset  . Hypertension Father   . Cancer Father     History   Social History  . Marital Status: Married    Spouse Name: N/A    Number of Children: N/A  . 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 Smoking status: Never Smoker   . Smokeless tobacco: None  . Alcohol Use: No  . Drug Use: No  . Sexually Active: Yes   Other Topics Concern  . None   Social History Narrative  . None    Current outpatient prescriptions:Multiple Vitamin (MULTIVITAMIN PO), Take by mouth.  , Disp: , Rfl: ;  Red Yeast Rice 600 MG CAPS, Take 1 capsule (600 mg total) by mouth 2 (two) times daily., Disp: , Rfl: ;  benzonatate (TESSALON PERLES) 100 MG capsule, Take 1 capsule (100 mg total) by mouth 3 (three) times daily as needed for cough., Disp: 20 capsule, Rfl: 0 sildenafil (VIAGRA) 100 MG tablet, Take 50-100 mg by mouth daily as needed., Disp: , Rfl: ;  sulfacetamide (BLEPH-10) 10 % ophthalmic solution, Place 1 drop into both eyes every 4 (four) hours., Disp: 15 mL, Rfl: 0;  tadalafil (CIALIS) 10 MG tablet, Take 10 mg by mouth as needed., Disp: , Rfl:   EXAM:  Filed Vitals:   01/27/12 1016  BP:  138/100  Pulse: 81  Temp: 98.7 F (37.1 C)   There is no height on file to calculate BMI.  GENERAL: vitals reviewed and listed above, alert, oriented, appears well hydrated and in no acute distress  HEENT: atraumatic, conjunttiva erythematous bilaterally and with watery drainage from both eyes, no obvious abnormalities on inspection of external nose and ears, normal appearance of ear canals and TMs, clear nasal congestion, mild post oropharyngeal erythema with PND, no tonsillar edema or exudate, no sinus TTP  NECK: no obvious masses on inspection  LUNGS: clear to auscultation bilaterally, no wheezes, rales or rhonchi, good air movement  CV: HRRR, no peripheral edema  MS: moves all extremities without noticeable abnormality  PSYCH: pleasant and cooperative, no obvious depression or anxiety  ASSESSMENT AND PLAN:  Discussed the following assessment and plan:  1. Upper respiratory infection  sulfacetamide (BLEPH-10) 10 % ophthalmic solution, benzonatate (TESSALON PERLES) 100 MG capsule  2. Hypertension, a little better on recheck. No signs of end organ damage. Pt has taken OTC cold medications which may have contributed to this. Have advised follow up with PCP in a few weeks to recheck and if still elevated needs BP medication. -work note given for today -Patient advised to return or notify a doctor immediately if symptoms worsen or persist or new concerns arise.  Patient Instructions  INSTRUCTIONS FOR  UPPER RESPIRATORY INFECTION: -use eye drops if eyes worsen or not better in 2- days, in the meantime use cool compresses to eye  -plenty of rest and fluids  -nasal saline wash 2-3 times daily (use prepackaged nasal saline or bottled/distilled water if making your own)   -clean nose with nasal saline before using the nasal steroid or sinex  -can use sinex nasal spray for drainage and nasal congestion - but do NOT use longer then 3-4 days  -can use tylenol or ibuprofen as directed  for aches and sorethroat  -in the winter time, using a humidifier at night is helpful (please follow cleaning instructions)  -if you are taking a cough medication - use only as directed, may also try a teaspoon of honey to coat the throat and throat lozenges  -for sore throat, salt water gargles can help  -follow up if you have fevers, are worsening or not getting better in 5-7 days      KIM, HANNAH R.

## 2012-01-27 NOTE — Patient Instructions (Addendum)
INSTRUCTIONS FOR UPPER RESPIRATORY INFECTION: -use eye drops if eyes worsen or not better in 2- days, in the meantime use cool compresses to eye  -wash hands frequently and cough into tissue to help decrease spread of infection  -plenty of rest and fluids  -nasal saline wash 2-3 times daily (use prepackaged nasal saline or bottled/distilled water if making your own)   -clean nose with nasal saline before using the nasal steroid or sinex  -can use sinex nasal spray for drainage and nasal congestion - but do NOT use longer then 3-4 days  -can use tylenol or ibuprofen as directed for aches and sorethroat  -in the winter time, using a humidifier at night is helpful (please follow cleaning instructions)  -if you are taking a cough medication - use only as directed, may also try a teaspoon of honey to coat the throat and throat lozenges  -for sore throat, salt water gargles can help  -follow up if you have fevers, are worsening or not getting better in 5-7 days

## 2012-01-27 NOTE — Telephone Encounter (Signed)
Caller: Adam Randolph/Patient; Patient Name: Adam Randolph; PCP: Darryll Capers (Adults only); Best Callback Phone Number: [phone]; Reason for call: Cough/Congestion. Fever unknown.  Onset 01/27/12.  Congestion with cough.  Pt eyes are matted shut on awakening.  Pt taking OTC Tylenol multi-symptom and getting no relief. Mild cough with green/yellow sputum.  All emergent symptoms ruled out per Upper Respiratory Infection with exception to 'New onset of eye redness, irritation/foreign body sensation or gritty feeling with yellow/green discharge and Cough with colored sputum'.  See Provider in 24 hrs.  Home care advice given. Appt scheduled 01/27/12 @ 1015 with Dr. Selena Batten.

## 2012-04-04 DIAGNOSIS — Z86718 Personal history of other venous thrombosis and embolism: Secondary | ICD-10-CM

## 2012-04-04 HISTORY — DX: Personal history of other venous thrombosis and embolism: Z86.718

## 2012-05-14 ENCOUNTER — Ambulatory Visit (INDEPENDENT_AMBULATORY_CARE_PROVIDER_SITE_OTHER): Payer: BC Managed Care – PPO | Admitting: Internal Medicine

## 2012-05-14 ENCOUNTER — Encounter: Payer: Self-pay | Admitting: Internal Medicine

## 2012-05-14 VITALS — BP 128/80 | HR 76 | Temp 98.0°F | Resp 16 | Ht 72.0 in | Wt 252.0 lb

## 2012-05-14 DIAGNOSIS — I10 Essential (primary) hypertension: Secondary | ICD-10-CM

## 2012-05-14 DIAGNOSIS — E785 Hyperlipidemia, unspecified: Secondary | ICD-10-CM

## 2012-05-14 MED ORDER — PITAVASTATIN CALCIUM 4 MG PO TABS: 1.0000 | ORAL_TABLET | ORAL | Status: DC

## 2012-05-14 NOTE — Progress Notes (Signed)
  Subjective:    Patient ID: Adam Randolph, male    DOB: April 27, 1955, 57 y.o.   MRN: [id number]  HPI  Has not bee taking the red rice yeast His LDL is greater that 160 Blood pressure at goal without any signs of CAD Discussed the used of statins and potential side effects    Review of Systems  Constitutional: Negative for fever and fatigue.  HENT: Negative for hearing loss, congestion, neck pain and postnasal drip.   Eyes: Negative for discharge, redness and visual disturbance.  Respiratory: Negative for cough, shortness of breath and wheezing.   Cardiovascular: Negative for leg swelling.  Gastrointestinal: Negative for abdominal pain, constipation and abdominal distention.  Genitourinary: Negative for urgency and frequency.  Musculoskeletal: Negative for joint swelling and arthralgias.  Skin: Negative for color change and rash.  Neurological: Negative for weakness and light-headedness.  Hematological: Negative for adenopathy.  Psychiatric/Behavioral: Negative for behavioral problems.   Past Medical History  Diagnosis Date  . HYPERLIPIDEMIA 06/11/2007  . UNSPECIFIED LABYRINTHITIS 02/15/2008  . HYPERTENSION 06/11/2007  . DIZZINESS, CHRONIC 08/19/2008  . Abdominal pain, epigastric 02/15/2008  . Decreased libido 08/29/2007  . DUODENAL ULCER, HX OF 04/01/2008    History   Social History  . Marital Status: Married    Spouse Name: N/A    Number of Children: N/A  . 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 Smoking status: Never Smoker   . Smokeless tobacco: Not on file  . Alcohol Use: No  . Drug Use: No  . Sexually Active: Yes   Other Topics Concern  . Not on file   Social History Narrative  . No narrative on file    Past Surgical History  Procedure Laterality Date  . Appendectomy    . Knee arthroscopy      right knee    Family History  Problem Relation Age of Onset  . Hypertension Father   . Cancer  Father     No Known Allergies  Current Outpatient Prescriptions on File Prior to Visit  Medication Sig Dispense Refill  . Multiple Vitamin (MULTIVITAMIN PO) Take by mouth.        . sildenafil (VIAGRA) 100 MG tablet Take 50-100 mg by mouth daily as needed.      . tadalafil (CIALIS) 10 MG tablet Take 10 mg by mouth as needed.       No current facility-administered medications on file prior to visit.    BP 128/80  Pulse 76  Temp(Src) 98 F (36.7 C)  Resp 16  Ht 6' (1.829 m)  Wt 252 lb (114.306 kg)  BMI 34.17 kg/m2       Objective:   Physical Exam  Constitutional: He appears well-developed and well-nourished.  HENT:  Head: Normocephalic and atraumatic.  Eyes: Conjunctivae are normal. Pupils are equal, round, and reactive to light.  Neck: Normal range of motion. Neck supple.  Cardiovascular: Normal rate and regular rhythm.   Pulmonary/Chest: Effort normal and breath sounds normal.  Abdominal: Soft. Bowel sounds are normal.          Assessment & Plan:  Pulse statins  Twice a week  livalo 4 mg teice a week Discussed statin use HTN stable Samples of medications given discussed the risks and reason for statin use  I have spent more than 30 minutes examining this patient face-to-face of which over half was spent in counseling

## 2012-08-02 DIAGNOSIS — Z86711 Personal history of pulmonary embolism: Secondary | ICD-10-CM

## 2012-08-02 HISTORY — DX: Personal history of pulmonary embolism: Z86.711

## 2012-08-24 ENCOUNTER — Ambulatory Visit: Payer: BC Managed Care – PPO | Admitting: Internal Medicine

## 2012-08-25 ENCOUNTER — Emergency Department (HOSPITAL_COMMUNITY): Payer: BC Managed Care – PPO

## 2012-08-25 ENCOUNTER — Encounter (HOSPITAL_COMMUNITY): Payer: Self-pay | Admitting: Family Medicine

## 2012-08-25 ENCOUNTER — Observation Stay (HOSPITAL_COMMUNITY)
Admission: EM | Admit: 2012-08-25 | Discharge: 2012-08-27 | DRG: 078 | Disposition: A | Discharge status: Home / Self Care | Payer: BC Managed Care – PPO | Attending: Internal Medicine | Admitting: Internal Medicine

## 2012-08-25 DIAGNOSIS — I2699 Other pulmonary embolism without acute cor pulmonale: Principal | ICD-10-CM | POA: Diagnosis present

## 2012-08-25 DIAGNOSIS — R0989 Other specified symptoms and signs involving the circulatory and respiratory systems: Secondary | ICD-10-CM | POA: Diagnosis present

## 2012-08-25 DIAGNOSIS — Z96659 Presence of unspecified artificial knee joint: Secondary | ICD-10-CM

## 2012-08-25 DIAGNOSIS — I10 Essential (primary) hypertension: Secondary | ICD-10-CM | POA: Diagnosis present

## 2012-08-25 DIAGNOSIS — R7301 Impaired fasting glucose: Secondary | ICD-10-CM | POA: Diagnosis present

## 2012-08-25 DIAGNOSIS — R0609 Other forms of dyspnea: Secondary | ICD-10-CM | POA: Diagnosis present

## 2012-08-25 DIAGNOSIS — Z8711 Personal history of peptic ulcer disease: Secondary | ICD-10-CM

## 2012-08-25 DIAGNOSIS — R7309 Other abnormal glucose: Secondary | ICD-10-CM | POA: Diagnosis present

## 2012-08-25 DIAGNOSIS — D649 Anemia, unspecified: Secondary | ICD-10-CM | POA: Diagnosis present

## 2012-08-25 DIAGNOSIS — R6882 Decreased libido: Secondary | ICD-10-CM | POA: Diagnosis present

## 2012-08-25 DIAGNOSIS — K269 Duodenal ulcer, unspecified as acute or chronic, without hemorrhage or perforation: Secondary | ICD-10-CM | POA: Diagnosis present

## 2012-08-25 DIAGNOSIS — D72829 Elevated white blood cell count, unspecified: Secondary | ICD-10-CM | POA: Diagnosis present

## 2012-08-25 DIAGNOSIS — E785 Hyperlipidemia, unspecified: Secondary | ICD-10-CM | POA: Diagnosis present

## 2012-08-25 DIAGNOSIS — R000 Tachycardia, unspecified: Secondary | ICD-10-CM | POA: Diagnosis present

## 2012-08-25 LAB — CBC WITH DIFFERENTIAL/PLATELET
Basophils Absolute: 0 10*3/uL (ref 0.0–0.1)
HCT: 29 % — ABNORMAL LOW (ref 39.0–52.0)
Lymphs Abs: 2.1 10*3/uL (ref 0.7–4.0)
MCH: 25.6 pg — ABNORMAL LOW (ref 26.0–34.0)
MCV: 72 fL — ABNORMAL LOW (ref 78.0–100.0)
Monocytes Absolute: 1.8 10*3/uL — ABNORMAL HIGH (ref 0.1–1.0)
Monocytes Relative: 10 % (ref 3–12)
Neutro Abs: 13.8 10*3/uL — ABNORMAL HIGH (ref 1.7–7.7)
Platelets: 478 10*3/uL — ABNORMAL HIGH (ref 150–400)
RDW: 14.4 % (ref 11.5–15.5)
WBC: 17.7 10*3/uL — ABNORMAL HIGH (ref 4.0–10.5)

## 2012-08-25 LAB — URINALYSIS, ROUTINE W REFLEX MICROSCOPIC
Glucose, UA: NEGATIVE mg/dL
Leukocytes, UA: NEGATIVE
pH: 7 (ref 5.0–8.0)

## 2012-08-25 LAB — BASIC METABOLIC PANEL
BUN: 16 mg/dL (ref 6–23)
CO2: 24 mEq/L (ref 19–32)
Calcium: 9.6 mg/dL (ref 8.4–10.5)
Chloride: 93 mEq/L — ABNORMAL LOW (ref 96–112)
Creatinine, Ser: 0.95 mg/dL (ref 0.50–1.35)
GFR calc Af Amer: >90 mL/min (ref >90)

## 2012-08-25 MED ORDER — RIVAROXABAN 20 MG PO TABS: 20.0000 mg | ORAL_TABLET | Freq: Every day | ORAL | Status: DC

## 2012-08-25 MED ORDER — ALBUTEROL SULFATE (5 MG/ML) 0.5% IN NEBU: 2.5000 mg | INHALATION_SOLUTION | RESPIRATORY_TRACT | Status: DC | PRN

## 2012-08-25 MED ORDER — ACETAMINOPHEN 650 MG RE SUPP: 650.0000 mg | Freq: Four times a day (QID) | RECTAL | Status: DC | PRN

## 2012-08-25 MED ORDER — LISINOPRIL 20 MG PO TABS
20.0000 mg | ORAL_TABLET | Freq: Every day | ORAL | Status: DC
  Administered 2012-08-25 – 2012-08-27 (×3): 20 mg via ORAL
  Filled 2012-08-25 (×3): qty 1

## 2012-08-25 MED ORDER — SODIUM CHLORIDE 0.9 % IV SOLN
INTRAVENOUS | Status: DC
  Administered 2012-08-25 – 2012-08-26 (×2): via INTRAVENOUS

## 2012-08-25 MED ORDER — SODIUM CHLORIDE 0.9 % IJ SOLN
3.0000 mL | Freq: Two times a day (BID) | INTRAMUSCULAR | Status: DC
  Administered 2012-08-25: 3 mL via INTRAVENOUS

## 2012-08-25 MED ORDER — OXYCODONE HCL 5 MG PO TABS
5.0000 mg | ORAL_TABLET | ORAL | Status: DC | PRN
  Administered 2012-08-26 – 2012-08-27 (×3): 5 mg via ORAL
  Filled 2012-08-25 (×3): qty 1

## 2012-08-25 MED ORDER — ACETAMINOPHEN 325 MG PO TABS
650.0000 mg | ORAL_TABLET | Freq: Four times a day (QID) | ORAL | Status: DC | PRN
  Administered 2012-08-26: 650 mg via ORAL
  Filled 2012-08-25: qty 2

## 2012-08-25 MED ORDER — RIVAROXABAN 15 MG PO TABS
15.0000 mg | ORAL_TABLET | Freq: Two times a day (BID) | ORAL | Status: DC
  Administered 2012-08-26 – 2012-08-27 (×3): 15 mg via ORAL
  Filled 2012-08-25 (×6): qty 1

## 2012-08-25 MED ORDER — PANTOPRAZOLE SODIUM 40 MG PO TBEC
40.0000 mg | DELAYED_RELEASE_TABLET | Freq: Every day | ORAL | Status: DC
  Administered 2012-08-26 – 2012-08-27 (×2): 40 mg via ORAL
  Filled 2012-08-25 (×2): qty 1

## 2012-08-25 MED ORDER — RIVAROXABAN 15 MG PO TABS
15.0000 mg | ORAL_TABLET | ORAL | Status: AC
  Administered 2012-08-25: 15 mg via ORAL
  Filled 2012-08-25: qty 1

## 2012-08-25 MED ORDER — ALUM & MAG HYDROXIDE-SIMETH 200-200-20 MG/5ML PO SUSP: 30.0000 mL | Freq: Four times a day (QID) | ORAL | Status: DC | PRN

## 2012-08-25 MED ORDER — ONDANSETRON HCL 4 MG/2ML IJ SOLN: 4.0000 mg | Freq: Four times a day (QID) | INTRAMUSCULAR | Status: DC | PRN

## 2012-08-25 MED ORDER — IOHEXOL 350 MG/ML SOLN
100.0000 mL | Freq: Once | INTRAVENOUS | Status: AC | PRN
  Administered 2012-08-25: 100 mL via INTRAVENOUS

## 2012-08-25 MED ORDER — ONDANSETRON HCL 4 MG PO TABS: 4.0000 mg | ORAL_TABLET | Freq: Four times a day (QID) | ORAL | Status: DC | PRN

## 2012-08-25 MED ORDER — RIVAROXABAN 15 MG PO TABS: 15.0000 mg | ORAL_TABLET | Freq: Two times a day (BID) | ORAL | Status: DC

## 2012-08-25 MED ORDER — DOCUSATE SODIUM 100 MG PO CAPS
100.0000 mg | ORAL_CAPSULE | Freq: Two times a day (BID) | ORAL | Status: DC
  Administered 2012-08-25 – 2012-08-27 (×4): 100 mg via ORAL
  Filled 2012-08-25 (×7): qty 1

## 2012-08-25 NOTE — ED Provider Notes (Signed)
History     CSN: [id number]  Arrival date & time 08/25/12  1039   First MD Initiated Contact with Patient 08/25/12 1126      Chief Complaint  Patient presents with  . Shortness of Breath  . Cough    (Consider location/radiation/quality/duration/timing/severity/associated sxs/prior treatment) HPI Comments: Pt presents to the ED for SOB.  Pt states over the past 2 days he has grown increasingly SOB, especially with exertion.  Was doing physical therapy yesterday and could not complete his session due to fatigue and labored breathing.  Also notes recent non-productive cough- no recent fevers, sweats, or chills.  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  No intra-operative or post-operative complications.  Currently on Lovenox 40mg  daily for ppx.  Wife notes BP has been elevated since surgery. Denies any chest pain, palpitations, or hemoptysis.  BM irregular- currently taking narcotic pain meds.  The history is provided by the patient.    Past Medical History  Diagnosis Date  . HYPERLIPIDEMIA 06/11/2007  . UNSPECIFIED LABYRINTHITIS 02/15/2008  . HYPERTENSION 06/11/2007  . DIZZINESS, CHRONIC 08/19/2008  . Abdominal pain, epigastric 02/15/2008  . Decreased libido 08/29/2007  . DUODENAL ULCER, HX OF 04/01/2008    Past Surgical History  Procedure Laterality Date  . Appendectomy    . Knee arthroscopy      right knee    Family History  Problem Relation Age of Onset  . Hypertension Father   . Cancer Father     History  Substance Use Topics  . Smoking status: Never Smoker   . Smokeless tobacco: Not on file  . Alcohol Use: No      Review of Systems  Respiratory: Positive for shortness of breath.   All other systems reviewed and are negative.    Allergies  Review of patient's allergies indicates no known allergies.  Home Medications   Current Outpatient Rx  Name  Route  Sig  Dispense  Refill  . docusate sodium (COLACE) 100 MG capsule   Oral   Take 100 mg by mouth 2 (two) times  daily.         Marland Kitchen enoxaparin (LOVENOX) 40 MG/0.4ML injection   Subcutaneous   Inject 40 mg into the skin daily.         Marland Kitchen lisinopril (PRINIVIL,ZESTRIL) 40 MG tablet   Oral   Take 20 mg by mouth daily.         . Multiple Vitamin (MULTIVITAMIN PO)   Oral   Take by mouth.           . oxyCODONE (OXY IR/ROXICODONE) 5 MG immediate release tablet   Oral   Take 5 mg by mouth every 4 (four) hours as needed for pain.         . sildenafil (VIAGRA) 100 MG tablet   Oral   Take 50-100 mg by mouth daily as needed.           BP 166/98  Pulse 105  Temp(Src) 98.1 F (36.7 C)  Resp 18  SpO2 100%  Physical Exam  Nursing note and vitals reviewed. Constitutional: He is oriented to person, place, and time. He appears well-developed and well-nourished. No distress.  HENT:  Head: Normocephalic and atraumatic.  Mouth/Throat: Oropharynx is clear and moist.  Eyes: Conjunctivae and EOM are normal. Pupils are equal, round, and reactive to light.  Neck: Normal range of motion. Neck supple.  Cardiovascular: Regular rhythm and normal heart sounds.  Tachycardia present.   Pulmonary/Chest: Effort normal and breath sounds normal.  No respiratory distress. He has no decreased breath sounds. He has no wheezes.  Abdominal: Soft. Bowel sounds are normal. There is no tenderness. There is no guarding.  Musculoskeletal: Normal range of motion.       Legs: Left TKA surgical incision, healing well without drainage, erythema, swelling, or signs of infection  Neurological: He is alert and oriented to person, place, and time.  Skin: Skin is warm and dry. He is not diaphoretic.  Psychiatric: He has a normal mood and affect.    ED Course  Procedures (including critical care time)   Date: 08/25/2012  Rate: 102  Rhythm: sinus tachycardia  QRS Axis: normal  Intervals: normal  ST/T Wave abnormalities: normal  Conduction Disutrbances:none  Narrative Interpretation: borderline LVH, sinus tach, no  STEMI  Old EKG Reviewed: unchanged    Labs Reviewed  CBC WITH DIFFERENTIAL  BASIC METABOLIC PANEL   Dg Chest 2 View  08/25/2012   *RADIOLOGY REPORT*  Clinical Data: Short of breath and cough  CHEST - 2 VIEW  Comparison: 02/10/2008  Findings: Normal heart size.  Clear lungs.  No pleural effusion or pneumothorax.  IMPRESSION: No active cardiopulmonary disease.   Original Report Authenticated By: Jolaine Click, M.D.   Ct Angio Chest Pe W/cm &/or Wo Cm  08/25/2012   *RADIOLOGY REPORT*  Clinical Data: Shortness of breath.  Recent total knee arthroplasty.  CT ANGIOGRAPHY CHEST  Technique:  Multidetector CT imaging of the chest using the standard protocol during bolus administration of intravenous contrast. Multiplanar reconstructed images including MIPs were obtained and reviewed to evaluate the vascular anatomy.  Contrast: OMNIPAQUE IOHEXOL 350 MG/ML SOLN  Comparison: None.  Findings: The chest wall is unremarkable.  The bony thorax is intact.  The heart is normal in size.  No pericardial effusion.  No mediastinal or hilar mass or adenopathy.  The esophagus is grossly normal.  The aorta is normal in caliber.  No dissection.  No significant atherosclerotic calcifications.  The pulmonary arterial tree is fairly well opacified.  There is a filling defect in the right upper lobe pulmonary artery including small branching vessels consistent with pulmonary embolism.  No left-sided pulmonary emboli or right lower lobe emboli.  The lungs are clear.  No pleural effusion.  The upper abdomen is unremarkable.  IMPRESSION:  1.  Small right upper lobe pulmonary embolism. 2.  No acute pulmonary findings.   Original Report Authenticated By: Rudie Meyer, M.D.     Dx: 1.  Pulmonary embolsim 2.  SOB    MDM   57 year old male presenting to the ED for shortness of breath. 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 No immediate intraoperative or postoperative complications. PERC positive, concern for PE. CT angiogram will be  obtained, basic labs ordered.  EKG sinus tach, no ischemic changes.  Trop neg.  CXR clear.  CT angio with small RUL PE.  Consulted hospitalist, Dr. Jerral Ralph, pt will be admitted.  VS stable for transfer.       Garlon Hatchet, PA-C 08/25/12 [phone]

## 2012-08-25 NOTE — H&P (Signed)
PATIENT DETAILS Name: Adam Randolph Age: 57 y.o. Sex: male Date of Birth: 1955-04-11 Admit Date: 08/25/2012 ION:[id number],XLKG EDWARD, MD   CHIEF COMPLAINT:  Shortness of breath for 2 days  HPI: Adam Randolph is a 57 y.o. male with a Past Medical History of left TKA on 08/15/12 in [place], [place], HTN who presents today with the above noted complaint.Per patient after his left knee replacement he has been on 40 mg of Lovenox, however yesterday he started having cough, Shortness of breath and fatigue.Apparently, while working with Physical Therapy yesterday he became very fatigues and short of breath. He denies any chest pain, but does endorse palpitations. Since his symptoms were persistent he decided to come to the ED today, where a CT angiogram was positive for a small Pulmonary Embolism.  Patient and spouse claim that they were told that he did have blood loss post Left TKA-but did not require a PRBC transfusion. He denies any headache, chest pain, vomiting or diarrhea. He is now being admitted for further evaluation.  ALLERGIES:  No Known Allergies  PAST MEDICAL HISTORY: Past Medical History  Diagnosis Date  . HYPERLIPIDEMIA 06/11/2007  . UNSPECIFIED LABYRINTHITIS 02/15/2008  . HYPERTENSION 06/11/2007  . DIZZINESS, CHRONIC 08/19/2008  . Abdominal pain, epigastric 02/15/2008  . Decreased libido 08/29/2007  . DUODENAL ULCER, HX OF 04/01/2008    PAST SURGICAL HISTORY: Past Surgical History  Procedure Laterality Date  . Appendectomy    . Knee arthroscopy      right knee    MEDICATIONS AT HOME: Prior to Admission medications   Medication Sig Start Date End Date Taking? Authorizing Provider  docusate sodium (COLACE) 100 MG capsule Take 100 mg by mouth 2 (two) times daily.   Yes Historical Provider, MD  enoxaparin (LOVENOX) 40 MG/0.4ML injection Inject 40 mg into the skin daily.   Yes Historical Provider, MD  lisinopril (PRINIVIL,ZESTRIL) 40 MG tablet Take 20 mg by mouth daily.   Yes  Historical Provider, MD  Multiple Vitamin (MULTIVITAMIN PO) Take by mouth.     Yes Historical Provider, MD  oxyCODONE (OXY IR/ROXICODONE) 5 MG immediate release tablet Take 5 mg by mouth every 4 (four) hours as needed for pain.   Yes Historical Provider, MD  sildenafil (VIAGRA) 100 MG tablet Take 50-100 mg by mouth daily as needed. 09/13/11   Stacie Glaze, MD    FAMILY HISTORY: Family History  Problem Relation Age of Onset  . Hypertension Father   . Cancer Father     SOCIAL HISTORY:  reports that he has never smoked. He does not have any smokeless tobacco history on file. He reports that he does not drink alcohol or use illicit drugs.  REVIEW OF SYSTEMS:  Constitutional:   No  weight loss, night sweats,    HEENT:    No headaches, Difficulty swallowing,Tooth/dental problems,Sore throat,  No sneezing, itching, ear ache, nasal congestion, post nasal drip,   Cardio-vascular: No chest pain,  Orthopnea, PND, swelling in lower extremities, anasarca,dizziness, palpitations  GI:  No heartburn, indigestion, abdominal pain, nausea, vomiting, diarrhea, change in bowel habits, loss of appetite  Resp:  No coughing up of blood.No change in color of mucus.No wheezing.No chest wall deformity  Skin:  no rash or lesions.  GU:  no dysuria, change in color of urine, no urgency or frequency.  No flank pain.  Musculoskeletal: No joint pain or swelling.  No decreased range of motion.  No back pain.  Psych: No change in mood or affect. No  depression or anxiety.  No memory loss.   PHYSICAL EXAM: Blood pressure 167/100, pulse 101, temperature 98 F (36.7 C), temperature source Oral, resp. rate 20, height 6' (1.829 m), weight 111.131 kg (245 lb), SpO2 100.00%.  General appearance :Awake, alert, not in any distress. Speech Clear. Not toxic Looking HEENT: Atraumatic and Normocephalic, pupils equally reactive to light and accomodation Neck: supple, no JVD. No cervical lymphadenopathy.   Chest:Good air entry bilaterally, no added sounds  CVS: S1 S2 regular, no murmurs.  Abdomen: Bowel sounds present, Non tender and not distended with no gaurding, rigidity or rebound. Extremities: B/L Lower Ext shows no edema, both legs are warm to touch. Left Knee and left thigh swollen-but dressing not soaked. Neurology: Awake alert, and oriented X 3, CN II-XII intact, Non focal Skin:No Rash Wounds:N/A  LABS ON ADMISSION:   Recent Labs  08/25/12 1219  NA 131*  K 4.2  CL 93*  CO2 24  GLUCOSE 124*  BUN 16  CREATININE 0.95  CALCIUM 9.6   No results found for this basename: AST, ALT, ALKPHOS, BILITOT, PROT, ALBUMIN,  in the last 72 hours No results found for this basename: LIPASE, AMYLASE,  in the last 72 hours  Recent Labs  08/25/12 1219  WBC 17.7*  NEUTROABS 13.8*  HGB 10.3*  HCT 29.0*  MCV 72.0*  PLT 478*   No results found for this basename: CKTOTAL, CKMB, CKMBINDEX, TROPONINI,  in the last 72 hours No results found for this basename: DDIMER,  in the last 72 hours No components found with this basename: POCBNP,    RADIOLOGIC STUDIES ON ADMISSION: Dg Chest 2 View  08/25/2012   *RADIOLOGY REPORT*  Clinical Data: Short of breath and cough  CHEST - 2 VIEW  Comparison: 02/10/2008  Findings: Normal heart size.  Clear lungs.  No pleural effusion or pneumothorax.  IMPRESSION: No active cardiopulmonary disease.   Original Report Authenticated By: Jolaine Click, M.D.   Ct Angio Chest Pe W/cm &/or Wo Cm  08/25/2012   *RADIOLOGY REPORT*  Clinical Data: Shortness of breath.  Recent total knee arthroplasty.  CT ANGIOGRAPHY CHEST  Technique:  Multidetector CT imaging of the chest using the standard protocol during bolus administration of intravenous contrast. Multiplanar reconstructed images including MIPs were obtained and reviewed to evaluate the vascular anatomy.  Contrast: OMNIPAQUE IOHEXOL 350 MG/ML SOLN  Comparison: None.  Findings: The chest wall is unremarkable.  The bony  thorax is intact.  The heart is normal in size.  No pericardial effusion.  No mediastinal or hilar mass or adenopathy.  The esophagus is grossly normal.  The aorta is normal in caliber.  No dissection.  No significant atherosclerotic calcifications.  The pulmonary arterial tree is fairly well opacified.  There is a filling defect in the right upper lobe pulmonary artery including small branching vessels consistent with pulmonary embolism.  No left-sided pulmonary emboli or right lower lobe emboli.  The lungs are clear.  No pleural effusion.  The upper abdomen is unremarkable.  IMPRESSION:  1.  Small right upper lobe pulmonary embolism. 2.  No acute pulmonary findings.   Original Report Authenticated By: Rudie Meyer, M.D.     ASSESSMENT AND PLAN: Present on Admission:  . Pulmonary embolism -Small PE seen on CT Chest-not hypoxic but slightly tachycardic -not started on any Anti-coagulation in the ED-will start Xarelto. -check a Doppler  .Anemia -suspect this is related to recent Left TKA-family does give history of post-operative blood loss -no evidence of any  overt blood loss, monitor Hb  .Leukocytosis -suspect this is reactive-no fever, check UA. Will not start any antibiotics for now. -culture if develops fever >101, and then start Abx  . HYPERTENSION -c/w Lisinopril  . Hx of Decreased libido -hold Viagra  Further plan will depend as patient's clinical course evolves and further radiologic and laboratory data become available. Patient will be monitored closely.  DVT Prophylaxis: Not needed as on Xarelto  Code Status: Full Code  Total time spent for admission equals 45 minutes.  [facility] Pager [phone]  If 7PM-7AM, please contact night-coverage [email or web address] Password [facility] 08/25/2012, 5:40 PM

## 2012-08-25 NOTE — ED Notes (Signed)
Recent knee surgery, pt c/o sob, diaphoresis, and tachycardia. Lungs CTA, pt not currently diaphoretic. Pt denies cp.

## 2012-08-25 NOTE — ED Notes (Signed)
Pt transported to xray 

## 2012-08-25 NOTE — ED Notes (Signed)
Per pt sts chill, cough, and SOB since yesterday. sts recent knee replacement.

## 2012-08-25 NOTE — Progress Notes (Signed)
ANTICOAGULATION CONSULT NOTE - Initial Consult  Pharmacy Consult for xarelto Indication: pulmonary embolus  No Known Allergies  Patient Measurements:    Vital Signs: Temp: 98.1 F (36.7 C) (05/24 1049) BP: 148/99 mmHg (05/24 1430) Pulse Rate: 102 (05/24 1430)  Labs:  Recent Labs  08/25/12 1219  HGB 10.3*  HCT 29.0*  PLT 478*  CREATININE 0.95    The CrCl is unknown because both a height and weight (above a minimum accepted value) are required for this calculation.   Medical History: Past Medical History  Diagnosis Date  . HYPERLIPIDEMIA 06/11/2007  . UNSPECIFIED LABYRINTHITIS 02/15/2008  . HYPERTENSION 06/11/2007  . DIZZINESS, CHRONIC 08/19/2008  . Abdominal pain, epigastric 02/15/2008  . Decreased libido 08/29/2007  . DUODENAL ULCER, HX OF 04/01/2008    Medications:  lovenox  Assessment: 57 yom s/p TKA on 5/14 and discharged on lovenox 40mg  presented to the ED with SOB. Small PE noted on CT. To start xarelto for anticoagulation. Pt is anemic H/H 10.3/29 and plts are elevated at 478. No bleeding noted. Pt has adequate renal function.    Plan:  1. Xarelto 15mg  BID x 3 weeks then 20mg  daily 2. F/u renal function and signs/symptoms of bleeding  Lavine Hargrove, Drake Leach 08/25/2012,2:40 PM

## 2012-08-26 DIAGNOSIS — R7301 Impaired fasting glucose: Secondary | ICD-10-CM

## 2012-08-26 DIAGNOSIS — I10 Essential (primary) hypertension: Secondary | ICD-10-CM

## 2012-08-26 DIAGNOSIS — R6882 Decreased libido: Secondary | ICD-10-CM

## 2012-08-26 DIAGNOSIS — I2699 Other pulmonary embolism without acute cor pulmonale: Secondary | ICD-10-CM

## 2012-08-26 DIAGNOSIS — M7989 Other specified soft tissue disorders: Secondary | ICD-10-CM

## 2012-08-26 LAB — COMPREHENSIVE METABOLIC PANEL
ALT: 105 U/L — ABNORMAL HIGH (ref 0–53)
AST: 55 U/L — ABNORMAL HIGH (ref 0–37)
CO2: 25 mEq/L (ref 19–32)
Chloride: 97 mEq/L (ref 96–112)
GFR calc non Af Amer: 75 mL/min — ABNORMAL LOW (ref >90)
Sodium: 133 mEq/L — ABNORMAL LOW (ref 135–145)
Total Bilirubin: 1 mg/dL (ref 0.3–1.2)

## 2012-08-26 MED ORDER — METOPROLOL TARTRATE 12.5 MG HALF TABLET
12.5000 mg | ORAL_TABLET | Freq: Two times a day (BID) | ORAL | Status: DC
  Administered 2012-08-26 – 2012-08-27 (×2): 12.5 mg via ORAL
  Filled 2012-08-26 (×3): qty 1

## 2012-08-26 NOTE — Progress Notes (Addendum)
*  PRELIMINARY RESULTS* Vascular Ultrasound Lower extremity venous duplex has been completed.  Preliminary findings: Right = no evidence of deep or superficial thrombosis. Left = There is a isolated thrombosis at the mid to distal calf. This could be a soleal vein. No other thrombosis noted on the left.  Farrel Demark, RDMS, RVT  08/26/2012, 10:25 AM

## 2012-08-26 NOTE — Evaluation (Signed)
Physical Therapy Evaluation Patient Details Name: Adam Randolph MRN: [id number] DOB: 12-30-1955 Today's Date: 08/26/2012 Time: 1308-6578 PT Time Calculation (min): 29 min  PT Assessment / Plan / Recommendation Clinical Impression  Patient is a 57 yo male admitted with pulmonary embolism.  Patient also noted to have DVT LLE.  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  Patient having increased pain in LLE. While checking for orthostatic BP, noted elevated BP (142/100).  RN called to room.  Patient will benefit from acute PT to maximize independence prior to discharge home with wife.  Recommend HHPT continue at discharge.    PT Assessment  Patient needs continued PT services    Follow Up Recommendations  Home health PT;Supervision/Assistance - 24 hour    Does the patient have the potential to tolerate intense rehabilitation      Barriers to Discharge None      Equipment Recommendations  None recommended by PT    Recommendations for Other Services     Frequency Min 4X/week    Precautions / Restrictions Precautions Precautions: Knee;Fall Restrictions Weight Bearing Restrictions: Yes LLE Weight Bearing: Weight bearing as tolerated   Pertinent Vitals/Pain Pain 9/10 with standing.  Impacting functional mobility.      Mobility  Bed Mobility Bed Mobility: Supine to Sit;Sitting - Scoot to Edge of Bed;Sit to Supine Supine to Sit: 5: Supervision;With rails;HOB flat Sitting - Scoot to Edge of Bed: 5: Supervision Sit to Supine: 4: Min assist;With rail;HOB flat Details for Bed Mobility Assistance: No cues needed.  Assist to bring LLE back onto bed. Transfers Transfers: Sit to Stand;Stand to Sit Sit to Stand: 4: Min guard;With upper extremity assist;From bed Stand to Sit: 4: Min guard;With upper extremity assist;To bed Details for Transfer Assistance: Verbal cues for hand placement.  Assist for balance.  In standing, patient's BP was 142/100.  Returned to sitting and RN called to room  - provided pain medicine (pain 9/10) Ambulation/Gait Ambulation/Gait Assistance: Not tested (comment)    Exercises     PT Diagnosis: Difficulty walking;Acute pain  PT Problem List: Decreased strength;Decreased range of motion;Decreased activity tolerance;Decreased balance;Decreased mobility;Pain PT Treatment Interventions: DME instruction;Gait training;Functional mobility training;Therapeutic exercise;Patient/family education   PT Goals Acute Rehab PT Goals PT Goal Formulation: With patient/family Time For Goal Achievement: 09/02/12 Potential to Achieve Goals: Good Pt will go Sit to Stand: with modified independence;with upper extremity assist PT Goal: Sit to Stand - Progress: Goal set today Pt will Ambulate: >150 feet;with modified independence;with rolling walker PT Goal: Ambulate - Progress: Goal set today Pt will Perform Home Exercise Program: Independently PT Goal: Perform Home Exercise Program - Progress: Goal set today  Visit Information  Last PT Received On: 08/26/12 Assistance Needed: +1    Subjective Data  Subjective: "My knee is really hurting" Patient Stated Goal: To stop hurting.  To get home   Prior Functioning  Home Living Lives With: Spouse Available Help at Discharge: Family;Available 24 hours/day Type of Home: House Home Access: Level entry Home Layout: Two level;Able to live on main level with bedroom/bathroom Bathroom Shower/Tub: Health visitor: Standard Bathroom Accessibility: Yes How Accessible: Accessible via walker Home Adaptive Equipment: Bedside commode/3-in-1;Shower chair with back;Walker - rolling Prior Function Level of Independence: Independent with assistive device(s);Needs assistance (Uses RW following knee surgery) Needs Assistance: Bathing;Meal Prep;Light Housekeeping Bath: Minimal Meal Prep: Maximal Light Housekeeping: Total Able to Take Stairs?: No Driving: No (Not for 6 weeks following surgery) Vocation:  (Truck  driver) Comments: Prior to [employer]  TKA on 08/15/12, patient was independent with ambulation and ADL's. Communication Communication: No difficulties    Cognition  Cognition Arousal/Alertness: Awake/alert Behavior During Therapy: WFL for tasks assessed/performed Overall Cognitive Status: Within Functional Limits for tasks assessed    Extremity/Trunk Assessment Right Upper Extremity Assessment RUE ROM/Strength/Tone: Within functional levels RUE Sensation: WFL - Light Touch Left Upper Extremity Assessment LUE ROM/Strength/Tone: Within functional levels LUE Sensation: WFL - Light Touch Right Lower Extremity Assessment RLE ROM/Strength/Tone: Within functional levels RLE Sensation: WFL - Light Touch Left Lower Extremity Assessment LLE ROM/Strength/Tone: Deficits;Unable to fully assess;Due to pain LLE ROM/Strength/Tone Deficits: Able to move LLE against gravity.  Decreased knee ROM from recent surgery. LLE Sensation: WFL - Light Touch Trunk Assessment Trunk Assessment: Normal   Balance    End of Session PT - End of Session Equipment Utilized During Treatment: Gait belt Activity Tolerance: Patient limited by pain;Treatment limited secondary to medical complications (Comment) (Increased BP with increased pain.) Patient left: in bed;with call bell/phone within reach;with nursing in room;with family/visitor present Nurse Communication: Mobility status;Patient requests pain meds (Elevated BP)  GP Functional Assessment Tool Used: Clinical judgement Functional Limitation: Mobility: Walking and moving around Mobility: Walking and Moving Around Current Status ([id number]): At least 20 percent but less than 40 percent impaired, limited or restricted Mobility: Walking and Moving Around Goal Status [phone]): At least 1 percent but less than 20 percent impaired, limited or restricted   Vena Austria 08/26/2012, 3:45 PM  Durenda Hurt. Renaldo Fiddler, [facility] Pager [phone]

## 2012-08-26 NOTE — Progress Notes (Addendum)
[facility] PROGRESS NOTE  Adam Randolph:[id number] DOB: 21-Jan-1956 DOA: 08/25/2012 PCP: Carrie Mew, MD  Assessment/Plan: 1. 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 HYPERTENSION  -c/w Lisinopril, add low dose BB  DVT Prophylaxis:  Not needed as on Xarelto   Code Status:FULL Family Communication: none at bedside Disposition Plan: home tomorrow if stable     HPI/Subjective: Feels dizzy with any activity and SoB  Objective: Filed Vitals:   08/26/12 1356 08/26/12 1503 08/26/12 1507 08/26/12 1511  BP: 142/87 148/91 149/94 142/100  Pulse: 99     Temp: 98.2 F (36.8 C)     TempSrc: Oral     Resp: 16     Height:      Weight:      SpO2: 99%       Intake/Output Summary (Last 24 hours) at 08/26/12 1617 Last data filed at 08/26/12 1300  Gross per 24 hour  Intake    480 ml  Output   1250 ml  Net   -770 ml   Filed Weights   08/25/12 1618  Weight: 111.131 kg (245 lb)    Exam:   General:  AAOx3, no distress  Cardiovascular: S1S2/RRR  Respiratory: CTAB  Abdomen: soft, NT, BS present Ext: Left Knee with swelling, Dressing    Data Reviewed: Basic Metabolic Panel:  Recent Labs Lab 08/25/12 1219 08/26/12 0450  NA 131* 133*  K 4.2 4.1  CL 93* 97  CO2 24 25  GLUCOSE 124* 130*  BUN 16 15  CREATININE 0.95 1.08  CALCIUM 9.6 9.2   Liver Function Tests:  Recent Labs Lab 08/26/12 0450  AST 55*  ALT 105*  ALKPHOS 121*  BILITOT 1.0  PROT 7.4  ALBUMIN 2.8*   No results found for this basename: LIPASE, AMYLASE,  in the last 168 hours No results found for this basename:  AMMONIA,  in the last 168 hours CBC:  Recent Labs Lab 08/25/12 1219  WBC 17.7*  NEUTROABS 13.8*  HGB 10.3*  HCT 29.0*  MCV 72.0*  PLT 478*   Cardiac Enzymes: No results found for this basename: CKTOTAL, CKMB, CKMBINDEX, TROPONINI,  in the last 168 hours BNP (last 3 results) No results found for this basename: PROBNP,  in the last 8760 hours CBG: No results found for this basename: GLUCAP,  in the last 168 hours  No results found for this or any previous visit (from the past 240 hour(s)).   Studies: Dg Chest 2 View  08/25/2012   *RADIOLOGY REPORT*  Clinical Data: Short of breath and cough  CHEST - 2 VIEW  Comparison: 02/10/2008  Findings: Normal heart size.  Clear lungs.  No pleural effusion or pneumothorax.  IMPRESSION: No active cardiopulmonary disease.   Original Report Authenticated By: Jolaine Click, M.D.   Ct Angio Chest Pe W/cm &/or Wo Cm  08/25/2012   *RADIOLOGY REPORT*  Clinical Data: Shortness of breath.  Recent total knee arthroplasty.  CT ANGIOGRAPHY CHEST  Technique:  Multidetector CT imaging of the chest using the standard protocol during bolus administration of intravenous contrast. Multiplanar reconstructed images including MIPs were obtained and reviewed to evaluate  the vascular anatomy.  Contrast: OMNIPAQUE IOHEXOL 350 MG/ML SOLN  Comparison: None.  Findings: The chest wall is unremarkable.  The bony thorax is intact.  The heart is normal in size.  No pericardial effusion.  No mediastinal or hilar mass or adenopathy.  The esophagus is grossly normal.  The aorta is normal in caliber.  No dissection.  No significant atherosclerotic calcifications.  The pulmonary arterial tree is fairly well opacified.  There is a filling defect in the right upper lobe pulmonary artery including small branching vessels consistent with pulmonary embolism.  No left-sided pulmonary emboli or right lower lobe emboli.  The lungs are clear.  No pleural effusion.  The upper abdomen is  unremarkable.  IMPRESSION:  1.  Small right upper lobe pulmonary embolism. 2.  No acute pulmonary findings.   Original Report Authenticated By: Rudie Meyer, M.D.    Scheduled Meds: . docusate sodium  100 mg Oral BID  . lisinopril  20 mg Oral Daily  . metoprolol tartrate  12.5 mg Oral BID  . pantoprazole  40 mg Oral Q1200  . rivaroxaban  15 mg Oral BID WC   Followed by  . [START ON 09/16/2012] rivaroxaban  20 mg Oral Q supper  . sodium chloride  3 mL Intravenous Q12H   Continuous Infusions: . sodium chloride 20 mL/hr at 08/25/12 1749    Principal Problem:   Pulmonary embolism Active Problems:   HYPERTENSION   Decreased libido   DUODENAL ULCER, HX OF    Time spent:    [facility]  [facility] Pager [phone]. If 7PM-7AM, please contact night-coverage at [email or web address], password [facility] 08/26/2012, 4:17 PM  LOS: 1 day

## 2012-08-26 NOTE — Progress Notes (Signed)
Pt up to ambulated, pt ambulated 75 ft and became diaphoretic and complained that he felt as if he was going to pass out. Pt assisted to wheelchair and taken back to room. Pt assisted back to bed and vital signs taken. MD made aware. Will continue to monitor.

## 2012-08-26 NOTE — ED Provider Notes (Signed)
Medical screening examination/treatment/procedure(s) were performed by non-physician practitioner and as supervising physician I was immediately available for consultation/collaboration.    Nelia Shi, MD 08/26/12 1106

## 2012-08-27 DIAGNOSIS — I10 Essential (primary) hypertension: Secondary | ICD-10-CM

## 2012-08-27 DIAGNOSIS — R7301 Impaired fasting glucose: Secondary | ICD-10-CM

## 2012-08-27 DIAGNOSIS — R6882 Decreased libido: Secondary | ICD-10-CM

## 2012-08-27 DIAGNOSIS — I2699 Other pulmonary embolism without acute cor pulmonale: Secondary | ICD-10-CM

## 2012-08-27 LAB — CBC
Hemoglobin: 9.6 g/dL — ABNORMAL LOW (ref 13.0–17.0)
MCH: 25.1 pg — ABNORMAL LOW (ref 26.0–34.0)
MCHC: 35 g/dL (ref 30.0–36.0)
Platelets: 479 10*3/uL — ABNORMAL HIGH (ref 150–400)

## 2012-08-27 LAB — BASIC METABOLIC PANEL
Calcium: 9.3 mg/dL (ref 8.4–10.5)
GFR calc non Af Amer: >90 mL/min (ref >90)
Glucose, Bld: 115 mg/dL — ABNORMAL HIGH (ref 70–99)
Sodium: 135 mEq/L (ref 135–145)

## 2012-08-27 LAB — LIPID PANEL
LDL Cholesterol: 115 mg/dL — ABNORMAL HIGH (ref 0–99)
Triglycerides: 91 mg/dL (ref <150)

## 2012-08-27 MED ORDER — RIVAROXABAN 15 MG PO TABS: 15.0000 mg | ORAL_TABLET | Freq: Two times a day (BID) | ORAL | Status: DC

## 2012-08-27 MED ORDER — METOPROLOL TARTRATE 12.5 MG HALF TABLET: 12.5000 mg | ORAL_TABLET | Freq: Two times a day (BID) | ORAL | Status: DC

## 2012-08-27 NOTE — Progress Notes (Signed)
Nutrition Brief Note  Patient identified on the Malnutrition Screening Tool (MST) Report. Per chart review, pt is consuming mostly 75 - 100% of his meals. Per EPIC weight hx, weights have been relatively stable:  Wt Readings from Last 15 Encounters:  08/25/12 245 lb (111.131 kg)  05/14/12 252 lb (114.306 kg)  01/27/12 251 lb (113.853 kg)  01/13/12 254 lb (115.214 kg)  09/13/11 248 lb (112.492 kg)  01/21/11 246 lb (111.585 kg)  08/12/10 240 lb (108.863 kg)  05/13/10 234 lb (106.142 kg)  05/10/10 240 lb (108.863 kg)  02/24/10 237 lb (107.502 kg)  10/10/08 255 lb (115.667 kg)  08/19/08 246 lb (111.585 kg)  08/15/08 249 lb (112.946 kg)  06/25/08 251 lb (113.853 kg)  06/12/08 251 lb (113.853 kg)    Body mass index is 33.22 kg/(m^2). Patient meets criteria for Obese Class I based on current BMI.   Current diet order is Heart Healthy, patient is consuming approximately 75 - 100% of meals at this time. Labs and medications reviewed.   No nutrition interventions warranted at this time. If nutrition issues arise, please consult RD.   Jarold Motto MS, RD, LDN Pager: [phone] After-hours pager: [phone]

## 2012-08-27 NOTE — Progress Notes (Signed)
Physical Therapy Treatment Patient Details Name: Adam Randolph MRN: [id number] DOB: 09/16/1955 Today's Date: 08/27/2012 Time: 4098-1191 PT Time Calculation (min): 28 min  PT Assessment / Plan / Recommendation Comments on Treatment Session  Patient continues to be limited by pain.  BP at 153/96 in standing (RN notified). Was able to ambulate short distance today without dizziness.      Follow Up Recommendations  Home health PT;Supervision/Assistance - 24 hour     Does the patient have the potential to tolerate intense rehabilitation     Barriers to Discharge        Equipment Recommendations  None recommended by PT    Recommendations for Other Services    Frequency Min 4X/week   Plan Discharge plan remains appropriate;Frequency remains appropriate    Precautions / Restrictions Precautions Precautions: Knee;Fall Restrictions Weight Bearing Restrictions: Yes LLE Weight Bearing: Weight bearing as tolerated   Pertinent Vitals/Pain BP 153/96 in standing    Mobility  Bed Mobility Bed Mobility: Supine to Sit;Sitting - Scoot to Edge of Bed;Sit to Supine Supine to Sit: 5: Supervision;With rails;HOB elevated Sitting - Scoot to Edge of Bed: 5: Supervision Sit to Supine: 5: Supervision;With rail;HOB elevated Details for Bed Mobility Assistance: Verbal cues to hook Rt foot behind Lt ankle to assist LLE onto bed to return to supine. Transfers Transfers: Sit to Stand;Stand to Sit Sit to Stand: 4: Min guard;With upper extremity assist;From bed;With armrests;From chair/3-in-1 Stand to Sit: 4: Min guard;With upper extremity assist;With armrests;To chair/3-in-1;To bed Details for Transfer Assistance: Verbal cues for hand placement and for placement of LLE when sitting into chair Ambulation/Gait Ambulation/Gait Assistance: 4: Min guard Ambulation Distance (Feet): 24 Feet Assistive device: Rolling walker Ambulation/Gait Assistance Details: Verbal cues to keep steps within RW - stepping  "beyone" the walker. Gait Pattern: Step-through pattern;Trunk flexed Gait velocity: Slow gait speed      PT Goals Acute Rehab PT Goals Pt will go Sit to Stand: with modified independence;with upper extremity assist PT Goal: Sit to Stand - Progress: Progressing toward goal Pt will Ambulate: >150 feet;with modified independence;with rolling walker PT Goal: Ambulate - Progress: Progressing toward goal Pt will Perform Home Exercise Program: Independently  Visit Information  Last PT Received On: 08/27/12 Assistance Needed: +1    Subjective Data  Subjective: "I'm feeling a little better.  But my knee still hurts"   Cognition  Cognition Arousal/Alertness: Awake/alert Behavior During Therapy: WFL for tasks assessed/performed Overall Cognitive Status: Within Functional Limits for tasks assessed    Balance     End of Session PT - End of Session Equipment Utilized During Treatment: Gait belt Activity Tolerance: Patient limited by pain Patient left: in bed;with call bell/phone within reach;with family/visitor present Nurse Communication: Mobility status (BP at 153/96 in standing)   GP     Vena Austria 08/27/2012, 12:21 PM Durenda Hurt. Renaldo Fiddler, [facility] Acute Rehab Services Pager [phone]

## 2012-08-27 NOTE — Progress Notes (Signed)
Utilization review completed. Dia Jefferys, RN, BSN. 

## 2012-08-27 NOTE — Progress Notes (Addendum)
Pt was lying in bed and adjusted pillow under head. He started feeling lightheaded, dizzy, and sweaty. Perspiration noted on forehead and nose. No complaints of pain or SOB. BP 141/93, O2 sat 97%, HR 85. Pt is starting to feel better but thinks he's dehydrated. Will continue to monitor.  Alfonso Ellis, RN

## 2012-08-27 NOTE — Discharge Summary (Addendum)
Physician Discharge Summary  Adam Randolph ZOX:[id number] DOB: [birth year] DOA: 08/25/2012  PCP: Carrie Mew, MD  Admit date: 08/25/2012 Discharge date: 08/27/2012  Time spent: 50 minutes  Recommendations for Outpatient Follow-up:  1. PCP in 1 week 2. FU Hbaic 3. BP meds will need further titration as outpatient  Discharge Diagnoses:  Principal Problem:   Pulmonary embolism   HYPERTENSION   Borderline DM   Dyslipidemia   Decreased libido   DUODENAL ULCER, HX OF   Fasting hyperglycemia   Discharge Condition: stable  Diet recommendation: low sodium, carb modified  Filed Weights   08/25/12 1618  Weight: 111.131 kg (245 lb)    History of present illness:  Adam Randolph is a 57 y.o. male with a Past Medical History of left TKA on 08/15/12 in [place], [place], HTN who presents today with the above noted complaint.Per patient after his left knee replacement he has been on 40 mg of Lovenox, however yesterday he started having cough, Shortness of breath and fatigue.Apparently, while working with Physical Therapy yesterday he became very fatigues and short of breath. He denies any chest pain, but does endorse palpitations. Since his symptoms were persistent he decided to come to the ED today, where a CT angiogram was positive for a small Pulmonary Embolism.  Patient and spouse claim that they were told that he did have blood loss post Left TKA-but did not require a PRBC transfusion.    Hospital Course:  . Pulmonary embolism /DVT  -Small PE seen on CT Chest- was tachycardic with minimal exertion and dyspneic on admission, clinically improved and stable, still with dyspnea on exertion. -O2 sats stable with ambulation. 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 HYPERTENSION  -c/w Lisinopril, added low dose BB on admission, this was continued  Hyperglycemia: -CBG fasting ranging from 110-120: consistent with borderline DM, Hbaic ordered and pending -lifestyle modification  -FU with PCP in 1 week  Dyslipidemia: -LDL 115 -lifestyle modification advised -re-check in 3 months, will need meds if not improved     Discharge Exam: Filed Vitals:   08/27/12 1129 08/27/12 1139 08/27/12 1417 08/27/12 1421  BP: 152/95 153/96 149/100 152/98  Pulse:   90   Temp:   98.3 F (36.8 C)   TempSrc:   Oral   Resp:   18   Height:      Weight:      SpO2:   100%     General: AAOx3 Cardiovascular: S1S2/RRR Respiratory: CTAB  Discharge Instructions  Discharge Orders   Future Orders Complete By Expires     Diet - low sodium heart healthy  As directed     Diet Carb Modified  As directed     Increase activity slowly  As directed         Medication List    STOP taking these medications       enoxaparin 40 MG/0.4ML injection  Commonly known as:  LOVENOX     sildenafil 100 MG tablet  Commonly known as:  VIAGRA      TAKE these medications       docusate sodium 100 MG capsule  Commonly known as:  COLACE  Take 100 mg by mouth 2 (two) times  daily.     lisinopril 40 MG tablet  Commonly known as:  PRINIVIL,ZESTRIL  Take 20 mg by mouth daily.     metoprolol tartrate 12.5 mg Tabs  Commonly known as:  LOPRESSOR  Take 0.5 tablets (12.5 mg total) by mouth 2 (two) times daily.     MULTIVITAMIN PO  Take by mouth.     oxyCODONE 5 MG immediate release tablet  Commonly known as:  Oxy IR/ROXICODONE  Take 5 mg by mouth every 4 (four) hours as needed for pain.     Rivaroxaban 15 MG Tabs tablet  Commonly known as:  XARELTO  Take 1 tablet (15 mg total) by mouth 2 (two) times daily with a meal. For 21 days then take 20mg  Daily       No Known Allergies     Follow-up Information   Follow up with  Carrie Mew, MD In 1 week.   Contact information:   [address] [place]ena Flake [place] [address] [phone]        The results of significant diagnostics from this hospitalization (including imaging, microbiology, ancillary and laboratory) are listed below for reference.    Significant Diagnostic Studies: Dg Chest 2 View  08/25/2012   *RADIOLOGY REPORT*  Clinical Data: Short of breath and cough  CHEST - 2 VIEW  Comparison: 02/10/2008  Findings: Normal heart size.  Clear lungs.  No pleural effusion or pneumothorax.  IMPRESSION: No active cardiopulmonary disease.   Original Report Authenticated By: Jolaine Click, M.D.   Ct Angio Chest Pe W/cm &/or Wo Cm  08/25/2012   *RADIOLOGY REPORT*  Clinical Data: Shortness of breath.  Recent total knee arthroplasty.  CT ANGIOGRAPHY CHEST  Technique:  Multidetector CT imaging of the chest using the standard protocol during bolus administration of intravenous contrast. Multiplanar reconstructed images including MIPs were obtained and reviewed to evaluate the vascular anatomy.  Contrast: OMNIPAQUE IOHEXOL 350 MG/ML SOLN  Comparison: None.  Findings: The chest wall is unremarkable.  The bony thorax is intact.  The heart is normal in size.  No pericardial effusion.  No mediastinal or hilar mass or adenopathy.  The esophagus is grossly normal.  The aorta is normal in caliber.  No dissection.  No significant atherosclerotic calcifications.  The pulmonary arterial tree is fairly well opacified.  There is a filling defect in the right upper lobe pulmonary artery including small branching vessels consistent with pulmonary embolism.  No left-sided pulmonary emboli or right lower lobe emboli.  The lungs are clear.  No pleural effusion.  The upper abdomen is unremarkable.  IMPRESSION:  1.  Small right upper lobe pulmonary embolism. 2.  No acute pulmonary findings.   Original Report Authenticated By: Rudie Meyer, M.D.    Microbiology: No results found for  this or any previous visit (from the past 240 hour(s)).   Labs: Basic Metabolic Panel:  Recent Labs Lab 08/25/12 1219 08/26/12 0450 08/27/12 0420  NA 131* 133* 135  K 4.2 4.1 4.3  CL 93* 97 99  CO2 24 25 24   GLUCOSE 124* 130* 115*  BUN 16 15 15   CREATININE 0.95 1.08 0.93  CALCIUM 9.6 9.2 9.3   Liver Function Tests:  Recent Labs Lab 08/26/12 0450  AST 55*  ALT 105*  ALKPHOS 121*  BILITOT 1.0  PROT 7.4  ALBUMIN 2.8*   No results found for this basename: LIPASE, AMYLASE,  in the last 168 hours No results found for this basename: AMMONIA,  in the last 168 hours CBC:  Recent Labs Lab 08/25/12 1219 08/27/12 0420  WBC 17.7* 18.4*  NEUTROABS 13.8*  --   HGB 10.3* 9.6*  HCT 29.0* 27.4*  MCV 72.0* 71.7*  PLT 478* 479*   Cardiac Enzymes: No results found for this basename: CKTOTAL, CKMB, CKMBINDEX, TROPONINI,  in the last 168 hours BNP: BNP (last 3 results) No results found for this basename: PROBNP,  in the last 8760 hours CBG: No results found for this basename: GLUCAP,  in the last 168 hours     Signed:  Mahagony Grieb  [facility] 08/27/2012, 2:38 PM

## 2012-08-29 ENCOUNTER — Telehealth: Payer: Self-pay | Admitting: Internal Medicine

## 2012-08-29 ENCOUNTER — Encounter: Payer: Self-pay | Admitting: Family Medicine

## 2012-08-29 ENCOUNTER — Ambulatory Visit (INDEPENDENT_AMBULATORY_CARE_PROVIDER_SITE_OTHER): Payer: Non-veteran care | Admitting: Family Medicine

## 2012-08-29 ENCOUNTER — Telehealth: Payer: Self-pay | Admitting: *Deleted

## 2012-08-29 VITALS — BP 130/90 | HR 101 | Temp 98.7°F

## 2012-08-29 DIAGNOSIS — I2699 Other pulmonary embolism without acute cor pulmonale: Secondary | ICD-10-CM

## 2012-08-29 DIAGNOSIS — I10 Essential (primary) hypertension: Secondary | ICD-10-CM

## 2012-08-29 DIAGNOSIS — L02419 Cutaneous abscess of limb, unspecified: Secondary | ICD-10-CM

## 2012-08-29 DIAGNOSIS — L03119 Cellulitis of unspecified part of limb: Secondary | ICD-10-CM

## 2012-08-29 DIAGNOSIS — L03116 Cellulitis of left lower limb: Secondary | ICD-10-CM

## 2012-08-29 MED ORDER — DOXYCYCLINE HYCLATE 100 MG PO CAPS: 100.0000 mg | ORAL_CAPSULE | Freq: Two times a day (BID) | ORAL | Status: AC

## 2012-08-29 MED ORDER — CEPHALEXIN 500 MG PO CAPS: 500.0000 mg | ORAL_CAPSULE | Freq: Three times a day (TID) | ORAL | Status: AC

## 2012-08-29 NOTE — Telephone Encounter (Signed)
Patient Information:  Caller Name: Jamisen  Phone: [phone]  Patient: Adam Randolph, Adam Randolph  Gender: Male  DOB: Oct 13, 1955  Age: 57 Years  PCP: Darryll Capers (Adults only)  Office Follow Up:  Does the office need to follow up with this patient?: No  Instructions For The Office: N/A  RN Note:  pt denies any pain.  Pt was discharged from the hospital 3 days ago.  Symptoms  Reason For Call & Symptoms: lightheaded, dizzy and sweating.  Pt was seen in the ER on 08/25/12 for PE.  Pt had knee surgery 2 weeks ago  Reviewed Health History In EMR: Yes  Reviewed Medications In EMR: Yes  Reviewed Allergies In EMR: Yes  Reviewed Surgeries / Procedures: Yes  Date of Onset of Symptoms: 08/29/2012  Guideline(s) Used:  Dizziness  Disposition Per Guideline:   See Today in Office  Reason For Disposition Reached:   Patient wants to be seen  Advice Given:  N/A  Patient Will Follow Care Advice:  YES  Appointment Scheduled:  08/29/2012 15:00:00 Appointment Scheduled Provider:  Gershon Crane (Family Practice)  (no appt found with Dr Lovell Sheehan or PA Orvan Falconer)

## 2012-08-29 NOTE — Progress Notes (Signed)
  Subjective:    Patient ID: Adam Randolph, male    DOB: December 30, 1955, 57 y.o.   MRN: [id number]  HPI Here with his wife for 2 days of chills, sweats, lightheadedness and weakness. No SOB or chest pain. No fever. He had a left knee replacement per Dr. Jarold Motto at [place], [place] on 08-14-12. He was sent home on Lovenox, but in spite of this developed a left leg DVT and a PE. He was admitted to [facility] from 08-21-12 to 08-25-12 for this and was switched to Xarelto. His labs looked good except for elevated WBC of 17 and 18. His BP was stable. After he was sent home his BP has been stable. However he has developed the sweats and chills and feeling poorly in the past 2 days.    Review of Systems  Constitutional: Positive for chills, diaphoresis and fatigue. Negative for fever.  Respiratory: Negative.   Cardiovascular: Negative.   Gastrointestinal: Negative.   Genitourinary: Negative.        Objective:   Physical Exam  Constitutional: He is oriented to person, place, and time.  Alert, in a wheelchair   Cardiovascular: Normal rate, regular rhythm, normal heart sounds and intact distal pulses.   Pulmonary/Chest: Effort normal and breath sounds normal.  Abdominal: Soft. Bowel sounds are normal. He exhibits no distension and no mass. There is no tenderness. There is no rebound and no guarding.  Neurological: He is alert and oriented to person, place, and time.  Skin:  He has Steristrips beneath gauze along a vertical incision over the left knee. The wound is closed and there is no drainage. The area is tender, swollen, and quite warm from the middle thigh to the middle shin          Assessment & Plan:  He seems to be developing some cellulitis at the surgical site. Check a CBC today. Start on Doxycycline and Keflex. He is scheduled to follow up with Dr. Amador Cunas on Friday.

## 2012-08-29 NOTE — Telephone Encounter (Signed)
Transitional care:  Admit date 08/25/2012 Discharge date:08/27/2012  Discharge diagnoses: Principal problem:  Pulmonary embolism  Hypertension  Borderline DM  Dyslipidemia  Decreased Libido  Duodenal Ulcer,HO  fasting hyperglycemia    Recommendations for outpatient follow-up: 1. PCP in 1 week 2.F/U [facility] 3.BP meds will need further titration  Discharge condition: stable  Talked with pt and feels weak,feverish, and sweaty. Will come in today and see dr fry for these symptoms.  See office notes.  Has follow up appointment with dr Amador Cunas on 08-31-2012.

## 2012-08-30 ENCOUNTER — Telehealth: Payer: Self-pay | Admitting: *Deleted

## 2012-08-30 ENCOUNTER — Telehealth: Payer: Self-pay | Admitting: Family Medicine

## 2012-08-30 LAB — CBC WITH DIFFERENTIAL/PLATELET
Basophils Relative: 0.7 % (ref 0.0–3.0)
Eosinophils Relative: 0.2 % (ref 0.0–5.0)
Lymphocytes Relative: 10.3 % — ABNORMAL LOW (ref 12.0–46.0)
MCV: 76.4 fl — ABNORMAL LOW (ref 78.0–100.0)
Monocytes Absolute: 1.3 10*3/uL — ABNORMAL HIGH (ref 0.1–1.0)
Monocytes Relative: 5.6 % (ref 3.0–12.0)
Neutrophils Relative %: 83.2 % — ABNORMAL HIGH (ref 43.0–77.0)
RBC: 4.19 Mil/uL — ABNORMAL LOW (ref 4.22–5.81)
WBC: 23.2 10*3/uL (ref 4.5–10.5)

## 2012-08-30 NOTE — Telephone Encounter (Signed)
Talked with dr fry and he doesn't think it is time for physical therapy now with the inflammation. Left message on machine' For kay at [facility] .  Also informed her she needs to get in touch with surgeon in [place] due to the inflammation

## 2012-08-30 NOTE — Telephone Encounter (Signed)
You saw on Wednesday- s/p knee replacement and you saw after discharge from hospital for pe and inflammation at incision site. [facility] needs another order to go to house for physical therapy since being discharge from hospital with pe.  Is his incision site ready for physical therpay?  He sees dr Amador Cunas tomorrow for f/u

## 2012-08-30 NOTE — Telephone Encounter (Signed)
Pt would like blood work results. Pt is aware may take more than one day. Pt saw dr Clent Ridges yesterday

## 2012-08-30 NOTE — Progress Notes (Signed)
Quick Note:  I spoke with pt and also released results in my chart. ______

## 2012-08-30 NOTE — Telephone Encounter (Signed)
See the lab report

## 2012-08-30 NOTE — Telephone Encounter (Signed)
I spoke with pt and gave results.  

## 2012-08-31 ENCOUNTER — Ambulatory Visit: Payer: Non-veteran care | Admitting: Internal Medicine

## 2012-09-12 ENCOUNTER — Telehealth: Payer: Self-pay | Admitting: Internal Medicine

## 2012-09-12 MED ORDER — RIVAROXABAN 20 MG PO TABS: 20.0000 mg | ORAL_TABLET | Freq: Every day | ORAL | Status: DC

## 2012-09-12 NOTE — Telephone Encounter (Signed)
Med sent in, but appointment made for him to see padonda next week

## 2012-09-12 NOTE — Telephone Encounter (Signed)
Pt was put on Rivaroxaban (XARELTO) 15 MG TABS tablet when he was in the hospital for blood clot. Pt needs a refill of this med. Pharm would not fill w/out MD authorization.  Pt states he believes he discussed w/ MD he was on XARELTO , but not any refills. Instructions on refill states he is to go up to 20 mg after finishing this med. Pls advise. Pharm: Human resources officer

## 2012-09-13 ENCOUNTER — Telehealth: Payer: Self-pay | Admitting: Internal Medicine

## 2012-09-13 NOTE — Telephone Encounter (Signed)
2 coupons given

## 2012-09-13 NOTE — Telephone Encounter (Signed)
PT called to see if we had any coupons for his RX of Rivaroxaban (XARELTO) 20 MG TABS. He stated that the pharmacy suggested that he do this prior to them filling this prescription. Please assist.

## 2012-09-14 ENCOUNTER — Encounter (HOSPITAL_COMMUNITY): Payer: Self-pay

## 2012-09-14 ENCOUNTER — Emergency Department (HOSPITAL_COMMUNITY)
Admission: EM | Admit: 2012-09-14 | Discharge: 2012-09-14 | Disposition: A | Discharge status: Home / Self Care | Payer: BC Managed Care – PPO | Attending: Emergency Medicine | Admitting: Emergency Medicine

## 2012-09-14 ENCOUNTER — Emergency Department (HOSPITAL_COMMUNITY): Payer: BC Managed Care – PPO

## 2012-09-14 DIAGNOSIS — R42 Dizziness and giddiness: Secondary | ICD-10-CM | POA: Insufficient documentation

## 2012-09-14 DIAGNOSIS — Z8719 Personal history of other diseases of the digestive system: Secondary | ICD-10-CM | POA: Insufficient documentation

## 2012-09-14 DIAGNOSIS — E785 Hyperlipidemia, unspecified: Secondary | ICD-10-CM | POA: Insufficient documentation

## 2012-09-14 DIAGNOSIS — Z8669 Personal history of other diseases of the nervous system and sense organs: Secondary | ICD-10-CM | POA: Insufficient documentation

## 2012-09-14 DIAGNOSIS — Z79899 Other long term (current) drug therapy: Secondary | ICD-10-CM | POA: Insufficient documentation

## 2012-09-14 DIAGNOSIS — I10 Essential (primary) hypertension: Secondary | ICD-10-CM | POA: Insufficient documentation

## 2012-09-14 LAB — PROTIME-INR
INR: 1.65 — ABNORMAL HIGH (ref 0.00–1.49)
Prothrombin Time: 19 seconds — ABNORMAL HIGH (ref 11.6–15.2)

## 2012-09-14 LAB — BASIC METABOLIC PANEL
CO2: 29 mEq/L (ref 19–32)
Calcium: 10.2 mg/dL (ref 8.4–10.5)
GFR calc Af Amer: >90 mL/min (ref >90)
GFR calc non Af Amer: >90 mL/min (ref >90)
Sodium: 136 mEq/L (ref 135–145)

## 2012-09-14 LAB — CBC WITH DIFFERENTIAL/PLATELET
Basophils Relative: 0 % (ref 0–1)
Eosinophils Absolute: 0 10*3/uL (ref 0.0–0.7)
Hemoglobin: 12.4 g/dL — ABNORMAL LOW (ref 13.0–17.0)
Lymphocytes Relative: 20 % (ref 12–46)
MCHC: 34.8 g/dL (ref 30.0–36.0)
Neutrophils Relative %: 74 % (ref 43–77)
RBC: 4.94 MIL/uL (ref 4.22–5.81)

## 2012-09-14 MED ORDER — MECLIZINE HCL 25 MG PO TABS: 25.0000 mg | ORAL_TABLET | Freq: Three times a day (TID) | ORAL | Status: DC | PRN

## 2012-09-14 MED ORDER — RIVAROXABAN 15 MG PO TABS
15.0000 mg | ORAL_TABLET | ORAL | Status: AC
  Administered 2012-09-14: 15 mg via ORAL
  Filled 2012-09-14: qty 1

## 2012-09-14 MED ORDER — MECLIZINE HCL 25 MG PO TABS
25.0000 mg | ORAL_TABLET | Freq: Once | ORAL | Status: AC
  Administered 2012-09-14: 25 mg via ORAL
  Filled 2012-09-14: qty 1

## 2012-09-14 NOTE — ED Notes (Signed)
Pt alert and mentating appropriately upon d/c. Pt given d/c teaching, prescription, and follow up care instructions. Pt verbalizes understanding of d/c teaching and has no further questions upon d/c. Pt requested wheelchair to car. Pt taken to vehicle and helped into car. NAD noted upon d/c.

## 2012-09-14 NOTE — ED Notes (Signed)
Pt presents with sudden onset of dizziness that began last night.  Pt reports dizziness even at rest, reports nausea and vomiting, +diaphoresis.  Pt denies any shortness of breath, denies any chest pain.  Reports h/o DVT and PE after knee surgery in May.

## 2012-09-14 NOTE — ED Provider Notes (Signed)
History     CSN: [id number]  Arrival date & time 09/14/12  1213   First MD Initiated Contact with Patient 09/14/12 1246      Chief Complaint - dizziness HPI Onset - last night It is intermittent Worse with - position Improved by - rest  Pt reports sudden onset of dizziness last night.  It has been intermittent since No falls but has felt unsteady on his feet He also had abrupt onset of nausea/vomiting/diaphoresis with dizziness No cp/sob but does report cough No abd pain No severe HA reported  He denies diarrhea No rectal bleeding reported No falls reported He is now feeling improved  He has h/o recent small PE and he is on xarelto and reports compliance Past Medical History  Diagnosis Date  . HYPERLIPIDEMIA 06/11/2007  . UNSPECIFIED LABYRINTHITIS 02/15/2008  . HYPERTENSION 06/11/2007  . DIZZINESS, CHRONIC 08/19/2008  . Abdominal pain, epigastric 02/15/2008  . Decreased libido 08/29/2007  . DUODENAL ULCER, HX OF 04/01/2008    Past Surgical History  Procedure Laterality Date  . Appendectomy    . Knee arthroscopy      right knee    Family History  Problem Relation Age of Onset  . Hypertension Father   . Cancer Father     History  Substance Use Topics  . Smoking status: Never Smoker   . Smokeless tobacco: Not on file  . Alcohol Use: No      Review of Systems  Respiratory: Negative for shortness of breath.   Cardiovascular: Negative for chest pain.  Neurological: Positive for dizziness. Negative for headaches.  All other systems reviewed and are negative.    Allergies  Review of patient's allergies indicates no known allergies.  Home Medications   Current Outpatient Rx  Name  Route  Sig  Dispense  Refill  . docusate sodium (COLACE) 100 MG capsule   Oral   Take 100 mg by mouth daily.          Marland Kitchen lisinopril (PRINIVIL,ZESTRIL) 40 MG tablet   Oral   Take 20 mg by mouth daily.         . metoprolol tartrate (LOPRESSOR) 12.5 mg TABS   Oral  Take 0.5 tablets (12.5 mg total) by mouth 2 (two) times daily.   30 tablet   0   . Multiple Vitamin (MULTIVITAMIN PO)   Oral   Take by mouth.           . oxyCODONE (OXY IR/ROXICODONE) 5 MG immediate release tablet   Oral   Take 5 mg by mouth every 4 (four) hours as needed for pain.         . Rivaroxaban (XARELTO) 20 MG TABS   Oral   Take 1 tablet (20 mg total) by mouth daily.   30 tablet   3     BP 142/98  Pulse 74  Temp(Src) 98.7 F (37.1 C)  Resp 20  SpO2 100%  Physical Exam CONSTITUTIONAL: Well developed/well nourished HEAD: Normocephalic/atraumatic EYES: EOMI/PERRL, no nystagmus ENMT: Mucous membranes moist NECK: supple no meningeal signs, no bruits SPINE:entire spine nontender CV: S1/S2 noted, no murmurs/rubs/gallops noted LUNGS: Lungs are clear to auscultation bilaterally, no apparent distress ABDOMEN: soft, nontender, no rebound or guarding GU:no cva tenderness NEURO:Awake/alert, facies symmetric, no arm or leg drift is noted Cranial nerves 3/4/5/6/10/10/08/11/12 tested and intact No ataxia but does have antalgic gait due to recent knee surgery No past pointing EXTREMITIES: pulses normal, full ROM, mild tenderness to left knee evidence of recent  surgery without erythema noted SKIN: warm, color normal PSYCH: no abnormalities of mood noted   ED Course  Procedures    3:57 PM All of his symptoms resolved He now reports this is similar to prior episode of vertigo and has used antivert He is well appearing, no vomiting and was ambulatory here in the ED without any focal neuro deficits Suspect peripheral vertigo rather than central vertigo I feel he is safe for d/c home.  Will start antivert We discussed strict return precautions Also - he needs one more dose of xarelto today before he transitions to xarelto 20mg  daily tomorrow Will dose here I doubt ACS or worsening PE at this time  MDM  Nursing notes including past medical history and social history  reviewed and considered in documentation xrays reviewed and considered Labs/vital reviewed and considered        Date: 09/14/2012  Rate: 77  Rhythm: normal sinus rhythm  QRS Axis: normal  Intervals: normal  ST/T Wave abnormalities: nonspecific ST changes  Conduction Disutrbances:none  Narrative Interpretation:   Old EKG Reviewed: unchanged    Joya Gaskins, MD 09/14/12 1559

## 2012-09-14 NOTE — ED Notes (Signed)
Pt taken to radiology

## 2012-09-14 NOTE — ED Notes (Signed)
Pt returned from radiology. Pt in restroom in room.

## 2012-09-19 ENCOUNTER — Ambulatory Visit (INDEPENDENT_AMBULATORY_CARE_PROVIDER_SITE_OTHER): Payer: BC Managed Care – PPO | Admitting: Family

## 2012-09-19 ENCOUNTER — Encounter: Payer: Self-pay | Admitting: Family

## 2012-09-19 VITALS — BP 120/92 | HR 70 | Wt 235.0 lb

## 2012-09-19 DIAGNOSIS — I10 Essential (primary) hypertension: Secondary | ICD-10-CM

## 2012-09-19 DIAGNOSIS — Z96659 Presence of unspecified artificial knee joint: Secondary | ICD-10-CM

## 2012-09-19 DIAGNOSIS — Z96652 Presence of left artificial knee joint: Secondary | ICD-10-CM

## 2012-09-19 DIAGNOSIS — I2699 Other pulmonary embolism without acute cor pulmonale: Secondary | ICD-10-CM

## 2012-09-19 NOTE — Progress Notes (Signed)
Subjective:    Patient ID: Adam Randolph, male    DOB: 06-07-1955, 57 y.o.   MRN: [id number]  HPI 57 year old African American male, nonsmoker, patient of Dr. Lovell Randolph is in as an emergency department followup. He was seen in the emergency department Saturday with complaints of vertigo. He had a CT scan of the head done that was negative as well the last several for negative. He does well taken Antivert to control the dizziness. Patient is status post total knee replacement of the left knee Aug 15 2012. Postoperatively, he developed a pulmonary embolism on day 7 and is currently on several toe 20 mg once daily. He tolerates the medication well. He's continuing to undergo physical therapy at home through the [place]. He's unsure if she will continue physical therapy after this week.   Review of Systems  Constitutional: Negative.   HENT: Negative.   Respiratory: Negative.   Cardiovascular: Negative.   Genitourinary: Positive for difficulty urinating.  Musculoskeletal: Negative.        Status post total left knee replacement  Skin: Negative.   Neurological: Positive for dizziness. Negative for weakness and headaches.  Hematological: Negative.   Psychiatric/Behavioral: Negative.        Objective:   Physical Exam  Constitutional: He is oriented to person, place, and time. He appears well-developed and well-nourished.  Neck: Normal range of motion. Neck supple. No thyromegaly present.  Cardiovascular: Normal rate, regular rhythm and normal heart sounds.   Pulmonary/Chest: Effort normal and breath sounds normal.  Abdominal: Soft. Bowel sounds are normal.  Musculoskeletal: Normal range of motion.  Incision healing well from the left total knee replacement.  Neurological: He is alert and oriented to person, place, and time. He has normal reflexes.  Skin: Skin is warm and dry.  Psychiatric: He has a normal mood and affect.          Assessment & Plan:  Assessment:  1. Vertigo 2. Status  post left total knee replacement 3. Pulmonary embolism 4. Hypertension  Plan: Continue current medications. Kidney function was already reassessed through the emergency department so no further labs are needed today. I've advised that is to be a does not continue physical therapy that we could certainly intervene to continue outpatient physical therapy as patient recovers from the total knee replacement. He will let me know BMI chart. Call the office with any questions or concerns. Recheck as scheduled and Dr. Lovell Randolph. Past Medical History  Diagnosis Date  . HYPERLIPIDEMIA 06/11/2007  . UNSPECIFIED LABYRINTHITIS 02/15/2008  . HYPERTENSION 06/11/2007  . DIZZINESS, CHRONIC 08/19/2008  . Abdominal pain, epigastric 02/15/2008  . Decreased libido 08/29/2007  . DUODENAL ULCER, HX OF 04/01/2008    History   Social History  . Marital Status: Married    Spouse Name: N/A    Number of Children: N/A  . 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 Smoking status: Never Smoker   . Smokeless tobacco: Not on file  . Alcohol Use: No  . Drug Use: No  . Sexually Active: Yes   Other Topics Concern  . Not on file   Social History Narrative  . No narrative on file    Past Surgical History  Procedure Laterality Date  . Appendectomy    . Knee arthroscopy      right knee    Family History  Problem Relation Age of Onset  . Hypertension Father   .  Cancer Father     No Known Allergies  Current Outpatient Prescriptions on File Prior to Visit  Medication Sig Dispense Refill  . meclizine (ANTIVERT) 25 MG tablet Take 1 tablet (25 mg total) by mouth 3 (three) times daily as needed.  28 tablet  0  . metoprolol tartrate (LOPRESSOR) 25 MG tablet Take 12.5 mg by mouth 2 (two) times daily.      . Multiple Vitamin (MULTIVITAMIN PO) Take 1 tablet by mouth daily.       Marland Kitchen oxyCODONE (OXY IR/ROXICODONE) 5 MG immediate release tablet Take 5 mg by  mouth every 4 (four) hours as needed for pain.      . Rivaroxaban (XARELTO) 20 MG TABS Take 1 tablet (20 mg total) by mouth daily.  30 tablet  3   No current facility-administered medications on file prior to visit.    BP 120/92  Pulse 70  Wt 235 lb (106.595 kg)  BMI 31.86 kg/m2  SpO2 98%chart

## 2012-10-09 ENCOUNTER — Telehealth: Payer: Self-pay | Admitting: Internal Medicine

## 2012-10-09 NOTE — Telephone Encounter (Signed)
Padonda, I will send this to you.  I thinks you put him on the zarelto, and dr Lovell Sheehan is out today. thanks

## 2012-10-09 NOTE — Telephone Encounter (Signed)
Please advise patient below 

## 2012-10-09 NOTE — Telephone Encounter (Signed)
Pt advised of Padonda's note.   States that he takes Xarelto with breakfast. I asked it breakfast is the largest meal of the day and he says it's probably the smallest. Advised pt to take it with the largest meal of the day.

## 2012-10-09 NOTE — Telephone Encounter (Signed)
Not likely Xarelto. That is not a typical side effect. Additionally, it appears he had dizziness prior to Xarelto according to hospital notes.

## 2012-10-09 NOTE — Telephone Encounter (Signed)
LMTCB

## 2012-10-09 NOTE — Telephone Encounter (Signed)
Patient Information:  Caller Name: Khriz  Phone: [phone]  Patient: Adam Randolph, Adam Randolph  Gender: Male  DOB: 12-Mar-1956  Age: 57 Years  PCP: Darryll Capers (Adults only)  Office Follow Up:  Does the office need to follow up with this patient?: Yes  Instructions For The Office: Please call back with MD recommendation.  RN Note:  Asking if dizziness is side effect of Xarelto.  Relates occasional diarrhea and dizziness episodes to taking Xarelto.  Occasional, mild diarrhea present; had 2-3 stools so far today. Unable to specify when diarrhea started. Dizziness occurs after morning Xarelto dose and is managed with Antivert.   Symptoms  Reason For Call & Symptoms: Reports episodes of dizziness lasting 5 minutes that begins within 10-15 minutes of taking Xarelto.  Seen in ED  08/25/12; diagnosed with small blood clot in leg and right lung and started on Xarelto.  Seen in ED again 09/19/12; diagnosed with vertigo; treated with Antivert.  Reviewed Health History In EMR: Yes  Reviewed Medications In EMR: Yes  Reviewed Allergies In EMR: Yes  Reviewed Surgeries / Procedures: Yes  Date of Onset of Symptoms: 09/09/2012  Treatments Tried: Takes Antivert  Treatments Tried Worked: Yes  Guideline(s) Used:  Dizziness  Disposition Per Guideline:   Discuss with PCP and Callback by Nurse Today  Reason For Disposition Reached:   Taking a medicine that could cause dizziness (e.g., blood pressure medications, diuretics)  Advice Given:  Temporary Dizziness  is usually a harmless symptom. It can be caused by not drinking enough water during sports or hot weather. It can also be caused by skipping a meal, too much sun exposure, standing up suddenly, standing too long in one place or even a viral illness.  Some Causes of Temporary Dizziness:  Standing Up Suddenly - Standing up suddenly (especially getting out of bed) or prolonged standing in one place are common causes of temporary dizziness. Not drinking  enough fluids always makes it worse. Certain medications can cause or increase this type of dizziness (e.g., blood pressure medications).  Rest for 1-2 Hours:  Lie down with feet elevated for 1 hour. This will improve blood flow and increase blood flow to the brain.  Stand Up Slowly:  In the mornings, sit up for a few minutes before you stand up. That will help your blood flow make the adjustment.  Sit down or lie down if you feel dizzy.  Call Back If:  Still feel dizzy after 2 hours of rest and fluids  Passes out (faints)  You become worse.  Patient Will Follow Care Advice:  YES

## 2012-10-13 ENCOUNTER — Other Ambulatory Visit: Payer: Self-pay | Admitting: Internal Medicine

## 2012-10-19 ENCOUNTER — Telehealth: Payer: Self-pay | Admitting: Internal Medicine

## 2012-10-19 NOTE — Telephone Encounter (Signed)
Patient Information:  Caller Name: Cameran  Phone: [phone]  Patient: Adam Randolph, Adam Randolph  Gender: Male  DOB: Sep 15, 1955  Age: 57 Years  PCP: Darryll Capers (Adults only)  Office Follow Up:  Does the office need to follow up with this patient?: No  Instructions For The Office: N/A  RN Note:  Pt has dry Cough, on Xarelto would like to know what OTC meds he can take.  Home care given.  Symptoms  Reason For Call & Symptoms: Cough w/ hot/cold flashes, med questions  Reviewed Health History In EMR: Yes  Reviewed Medications In EMR: Yes  Reviewed Allergies In EMR: Yes  Reviewed Surgeries / Procedures: Yes  Date of Onset of Symptoms: 10/17/2012  Treatments Tried: Deslym  Treatments Tried Worked: No  Guideline(s) Used:  Cough  Disposition Per Guideline:   Home Care  Reason For Disposition Reached:   Cough with no complications  Advice Given:  Reassurance  Coughing is the way that our lungs remove irritants and mucus. It helps protect our lungs from getting pneumonia.  Cough Medicines:  Home Remedy - Honey: This old home remedy has been shown to help decrease coughing at night. The adult dosage is 2 teaspoons (10 ml) at bedtime. Honey should not be given to infants under one year of age.  Patient Will Follow Care Advice:  YES

## 2012-11-03 ENCOUNTER — Encounter: Payer: Self-pay | Admitting: Family Medicine

## 2012-11-03 ENCOUNTER — Ambulatory Visit (INDEPENDENT_AMBULATORY_CARE_PROVIDER_SITE_OTHER): Payer: BC Managed Care – PPO | Admitting: Family Medicine

## 2012-11-03 VITALS — BP 120/80 | HR 84 | Temp 97.6°F | Ht 70.0 in

## 2012-11-03 DIAGNOSIS — J069 Acute upper respiratory infection, unspecified: Secondary | ICD-10-CM

## 2012-11-03 MED ORDER — ALBUTEROL SULFATE HFA 108 (90 BASE) MCG/ACT IN AERS: 2.0000 | INHALATION_SPRAY | Freq: Four times a day (QID) | RESPIRATORY_TRACT | Status: DC | PRN

## 2012-11-03 MED ORDER — BENZONATATE 100 MG PO CAPS: 100.0000 mg | ORAL_CAPSULE | Freq: Three times a day (TID) | ORAL | Status: DC | PRN

## 2012-11-03 NOTE — Progress Notes (Signed)
SUBJECTIVE:  Adam Randolph is a very pleasant 57 y.o. male who complains of coryza, congestion, sneezing, sore throat and swollen glands for 10 days. He denies a history of chills, dizziness, fevers, myalgias, shortness of breath, sweats and vomiting and denies a history of asthma. Patient denies smoke cigarettes.   He is on Xarelto for acute PE s/p TKA.  Understandably hesitant to take any medications since he is taking Xarelto.  Patient Active Problem List   Diagnosis Date Noted  . Fasting hyperglycemia 08/27/2012  . Pulmonary embolism 08/25/2012  . GERD (gastroesophageal reflux disease) 05/10/2010  . DUODENAL ULCER, HX OF 04/01/2008  . UNSPECIFIED LABYRINTHITIS 02/15/2008  . SEBACEOUS CYST 08/29/2007  . Decreased libido 08/29/2007  . HYPERLIPIDEMIA 06/11/2007  . HYPERTENSION 06/11/2007   Past Medical History  Diagnosis Date  . HYPERLIPIDEMIA 06/11/2007  . UNSPECIFIED LABYRINTHITIS 02/15/2008  . HYPERTENSION 06/11/2007  . DIZZINESS, CHRONIC 08/19/2008  . Abdominal pain, epigastric 02/15/2008  . Decreased libido 08/29/2007  . DUODENAL ULCER, HX OF 04/01/2008   Past Surgical History  Procedure Laterality Date  . Appendectomy    . Knee arthroscopy      right knee   History  Substance Use Topics  . Smoking status: Never Smoker   . Smokeless tobacco: Not on file  . Alcohol Use: No   Family History  Problem Relation Age of Onset  . Hypertension Father   . Cancer Father    No Known Allergies Current Outpatient Prescriptions on File Prior to Visit  Medication Sig Dispense Refill  . meclizine (ANTIVERT) 25 MG tablet TAKE ONE TABLET BY MOUTH THREE TIMES DAILY AS NEEDED  28 tablet  3  . Multiple Vitamin (MULTIVITAMIN PO) Take 1 tablet by mouth daily.       . Rivaroxaban (XARELTO) 20 MG TABS Take 1 tablet (20 mg total) by mouth daily.  30 tablet  3   No current facility-administered medications on file prior to visit.   The PMH, PSH, Social History, Family History,  Medications, and allergies have been reviewed in [facility] [address], and have been updated if relevant.  OBJECTIVE:  BP 120/80  Pulse 84  Temp(Src) 97.6 F (36.4 C) (Oral)  Ht 5\' 10"  (1.778 m)  SpO2 97%  He appears well, vital signs are as noted. Ears normal.  Throat and pharynx normal.  Neck supple. No adenopathy in the neck. Nose is congested. Sinuses non tender. The chest is clear, without wheezes or rales.  ASSESSMENT:  viral upper respiratory illness  PLAN: Lungs are actually quite clear today- no wheezing or crackes.   Symptomatic therapy suggested: push fluids, rest and return office visit prn if symptoms persist or worsen. Lack of antibiotic effectiveness discussed with him. Proair and tessalon perles as needed. Call or return to clinic prn if these symptoms worsen or fail to improve as anticipated.

## 2012-11-03 NOTE — Patient Instructions (Addendum)
It was nice to meet you, Mr. Biggar. Please take Tessalon perles as needed for cough- 1 tablet three times daily as needed. You can also use an albuterol inhaler as needed for cough, SOB or wheezing.  Please call your doctor next week with an update.  Debrox drops are used for wax.

## 2012-12-21 ENCOUNTER — Encounter: Payer: Self-pay | Admitting: Internal Medicine

## 2012-12-21 ENCOUNTER — Ambulatory Visit (INDEPENDENT_AMBULATORY_CARE_PROVIDER_SITE_OTHER): Payer: BC Managed Care – PPO | Admitting: Internal Medicine

## 2012-12-21 VITALS — BP 130/84 | HR 72 | Temp 98.4°F | Resp 16 | Ht 70.0 in | Wt 260.0 lb

## 2012-12-21 DIAGNOSIS — Z5181 Encounter for therapeutic drug level monitoring: Secondary | ICD-10-CM

## 2012-12-21 DIAGNOSIS — Z23 Encounter for immunization: Secondary | ICD-10-CM

## 2012-12-21 DIAGNOSIS — I2699 Other pulmonary embolism without acute cor pulmonale: Secondary | ICD-10-CM

## 2012-12-21 NOTE — Progress Notes (Signed)
  Subjective:    Patient ID: Adam Randolph, male    DOB: 1955/08/28, 57 y.o.   MRN: [id number]  HPI  Patient is a 57 year old male who had a pulmonary emboli related to orthopedic surgery he has been on oral anticoagulant since that time and has been doing well blood pressure stable pulse is stable no symptoms of recurrent PE.  Recommended he continue the xarelto through November for 6 months total therapy  Review of Systems  Constitutional: Negative for fever and fatigue.  HENT: Negative for hearing loss, congestion, neck pain and postnasal drip.   Eyes: Negative for discharge, redness and visual disturbance.  Respiratory: Negative for cough, shortness of breath and wheezing.   Cardiovascular: Negative for leg swelling.  Gastrointestinal: Negative for abdominal pain, constipation and abdominal distention.  Genitourinary: Negative for urgency and frequency.  Musculoskeletal: Negative for joint swelling and arthralgias.  Skin: Negative for color change and rash.  Neurological: Negative for weakness and light-headedness.  Hematological: Negative for adenopathy.  Psychiatric/Behavioral: Negative for behavioral problems.       Objective:   Physical Exam  Constitutional: He appears well-developed and well-nourished.  HENT:  Head: Normocephalic and atraumatic.  Eyes: Conjunctivae are normal. Pupils are equal, round, and reactive to light.  Neck: Normal range of motion. Neck supple.  Cardiovascular: Normal rate and regular rhythm.   Pulmonary/Chest: Effort normal and breath sounds normal.  Abdominal: Soft. Bowel sounds are normal.          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

## 2012-12-21 NOTE — Patient Instructions (Addendum)
Stop the anticoagulant in November

## 2013-02-11 ENCOUNTER — Telehealth: Payer: Self-pay | Admitting: Internal Medicine

## 2013-02-11 NOTE — Telephone Encounter (Signed)
According to earlier visit, pt is supposed to stop Rivaroxaban (XARELTO) 20 MG TABS in nov. Pt is now out of this med and wants to know what day should he stop. Pt will need refill if he needs to continue any more. pla advise Pharm: [facility]/ [address]

## 2013-02-11 NOTE — Telephone Encounter (Signed)
Ma y stop xarelton per dr Lovell Sheehan

## 2013-05-17 ENCOUNTER — Other Ambulatory Visit: Payer: Non-veteran care

## 2013-05-24 ENCOUNTER — Encounter: Payer: Non-veteran care | Admitting: Internal Medicine

## 2013-12-30 ENCOUNTER — Encounter: Payer: Self-pay | Admitting: Gastroenterology

## 2014-02-01 IMAGING — CR DG CHEST 2V
2 series · 2 of 2 positions shown · non-contrast
Comparison: 08/25/2012

CLINICAL DATA: Hypertension, cough, headache

CHEST - 2 VIEW

[w chest pa]
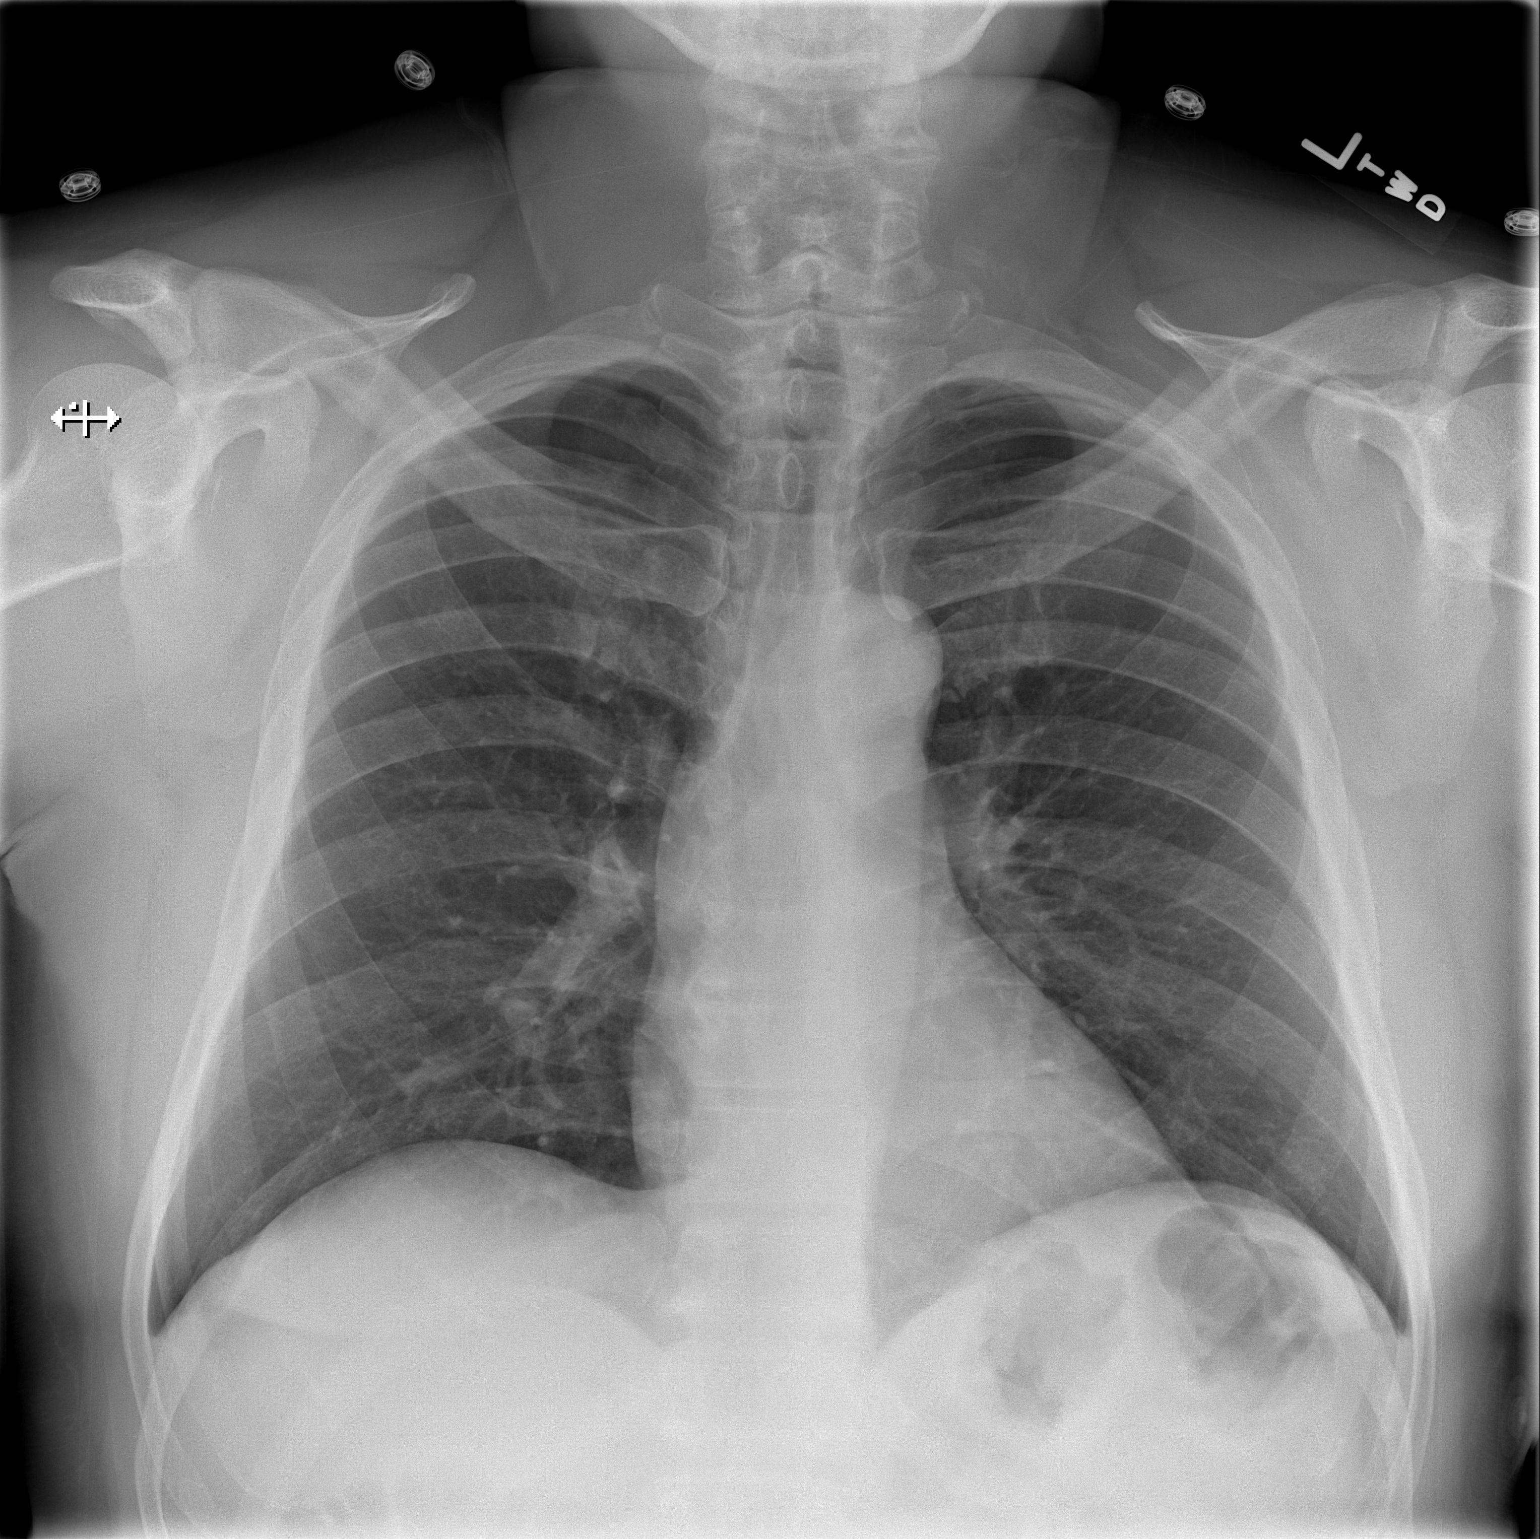

[w chest lat]
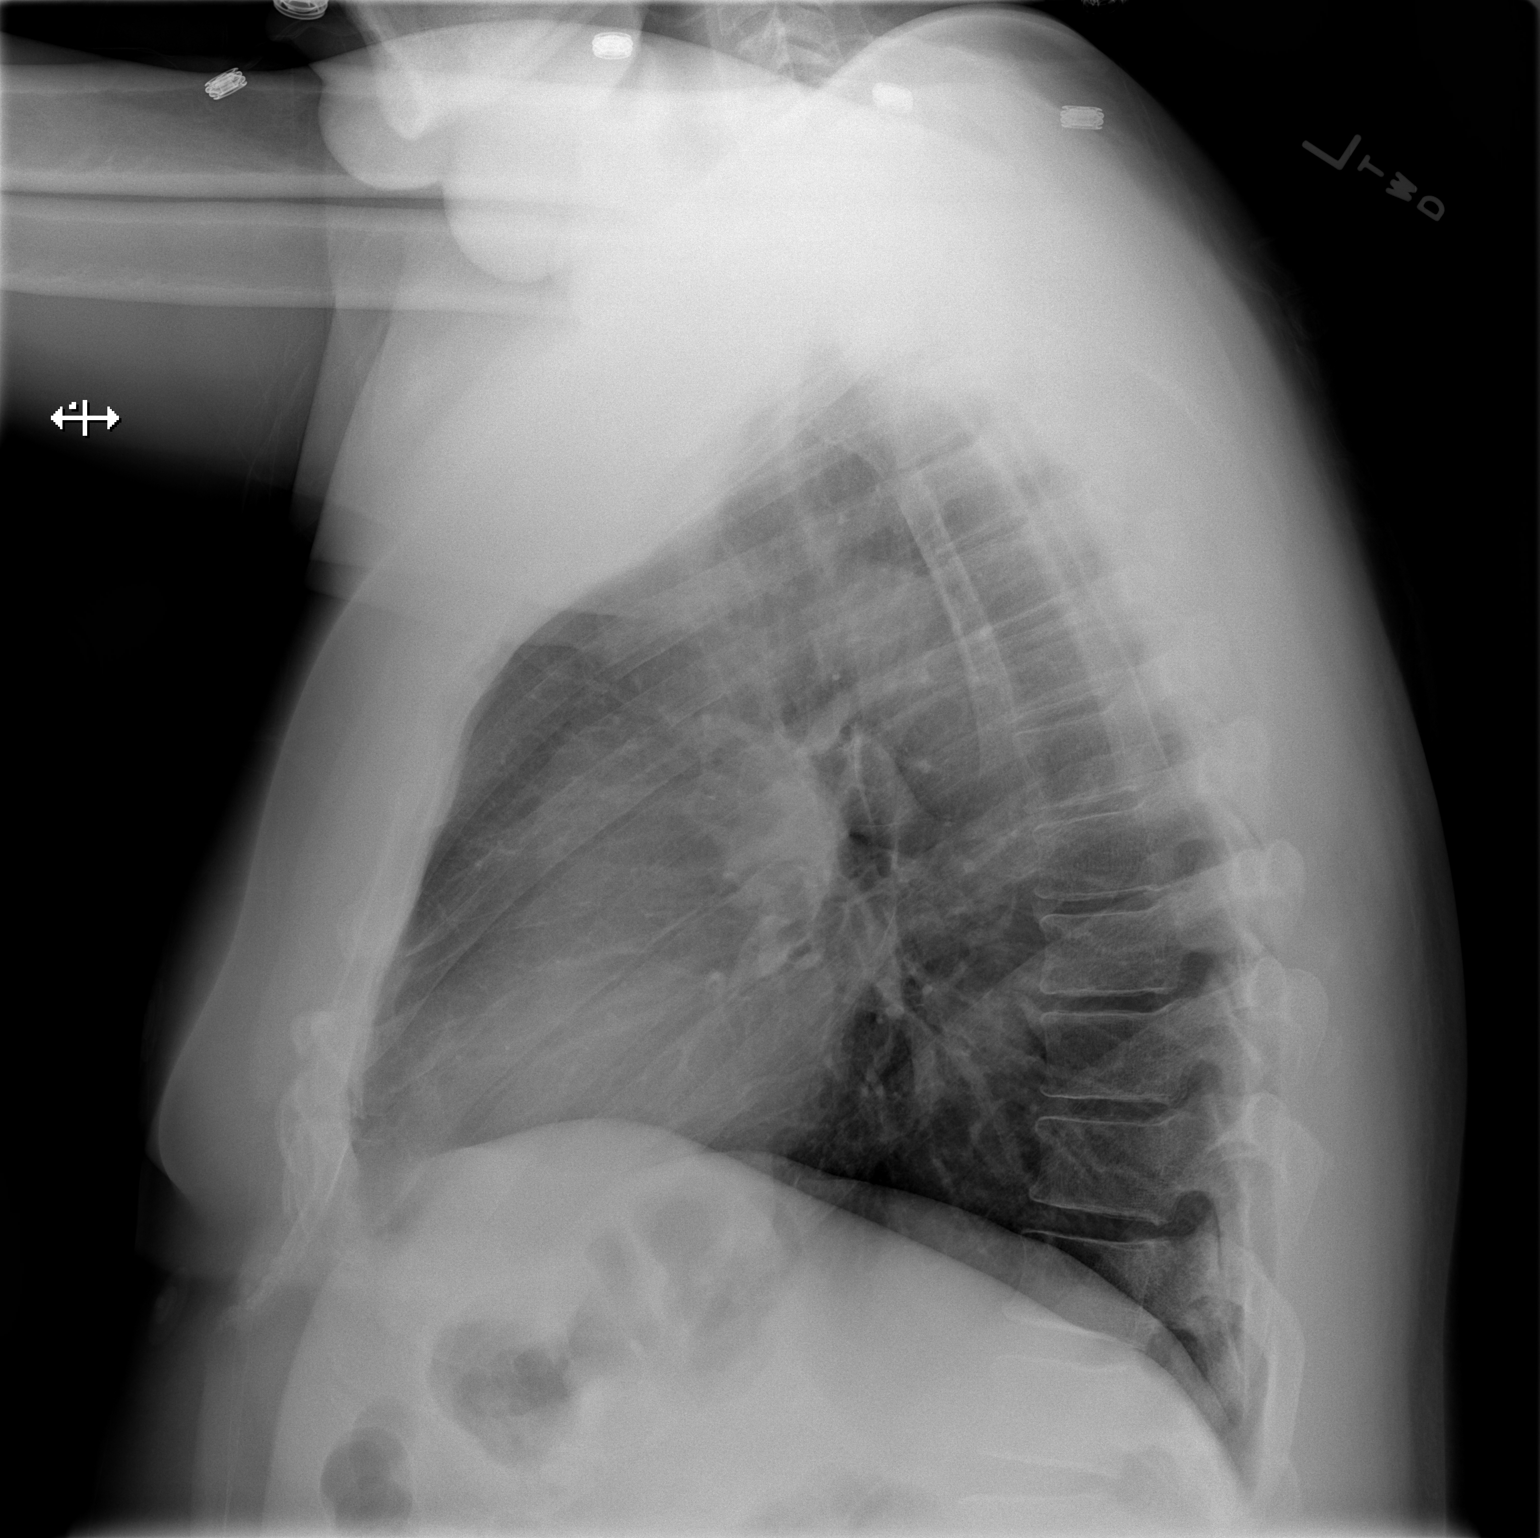

[2 of 2 positions shown; findings below may reference images not displayed]

FINDINGS: Cardiomediastinal silhouette is stable.  No acute
infiltrate or pleural effusion.  No pulmonary edema.  Bony thorax
is stable.
IMPRESSION: No active disease.  No significant change.

## 2014-02-01 IMAGING — CT CT HEAD W/O CM
1 series · 16 of 30 positions shown, 20 images · non-contrast
Comparison: 08/20/2008

CLINICAL DATA: Sudden onset of dizziness.

CT HEAD WITHOUT CONTRAST
TECHNIQUE: Contiguous axial images were obtained from the base of
the skull through the vertex without contrast.

[Series 2: head trauma 4.8 h37s · axial · 0.44mm/px · z∈[-116,+41]mm · 16 of 36 slices shown, 20 images]
[im 2/36  brain]
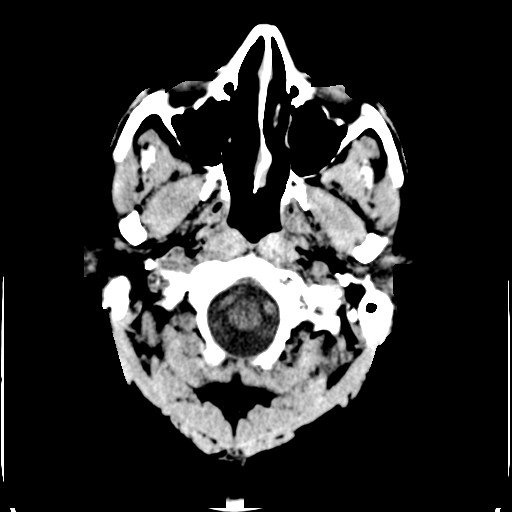
[im 2/36  bone]
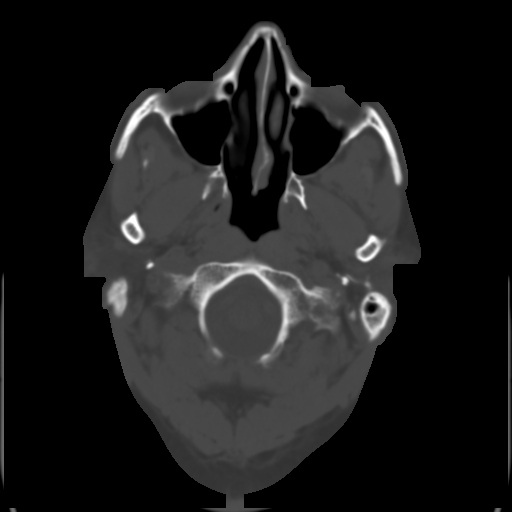
[im 4/36  brain]
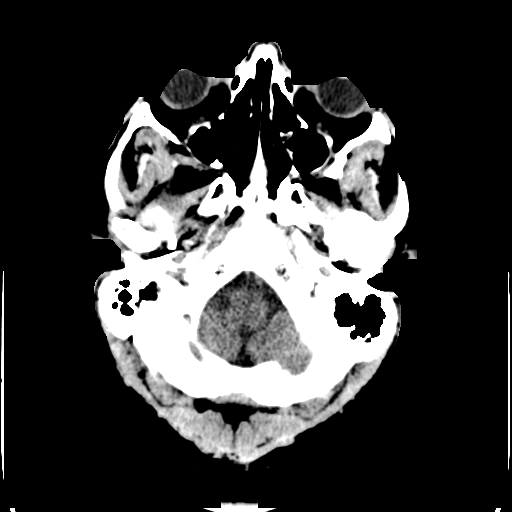
[im 7/36  brain]
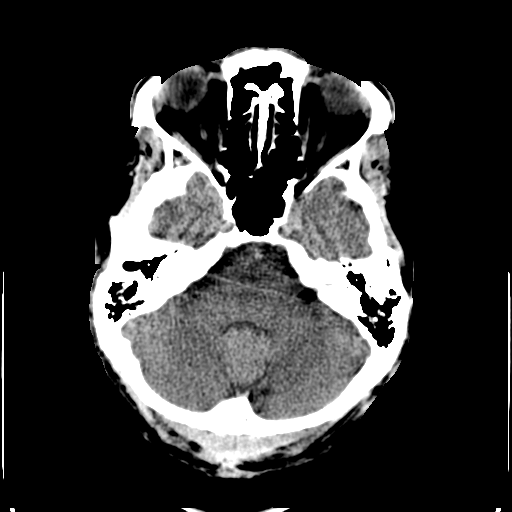
[im 9/36  brain]
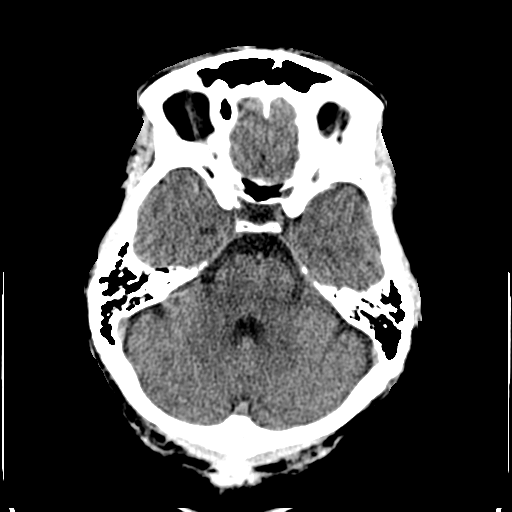
[im 10/36  brain]
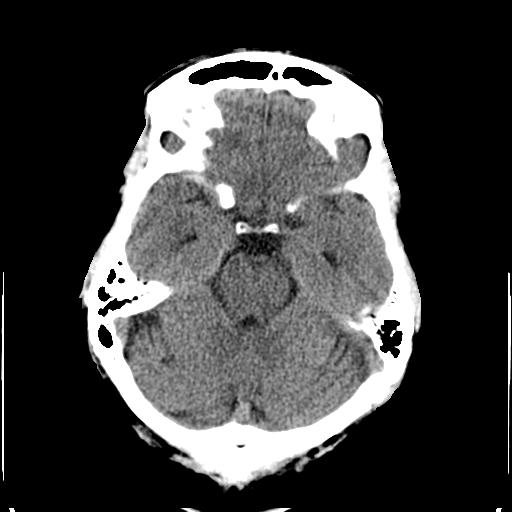
[im 10/36  bone]
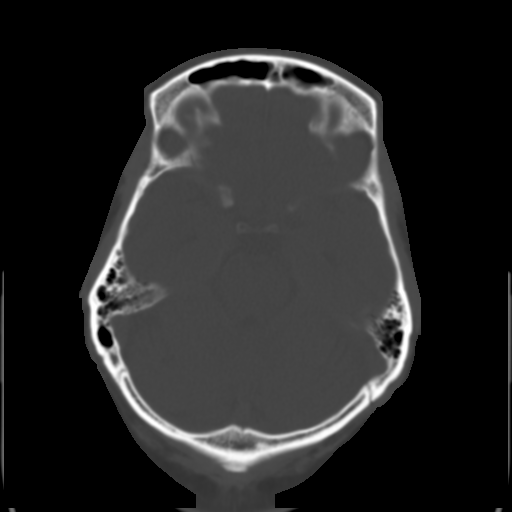
[im 13/36  brain]
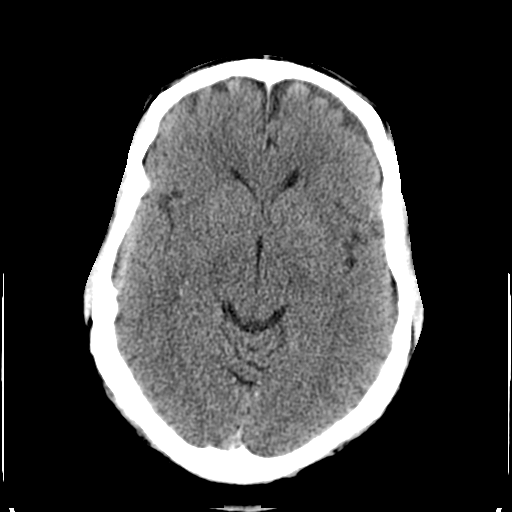
[im 15/36  brain]
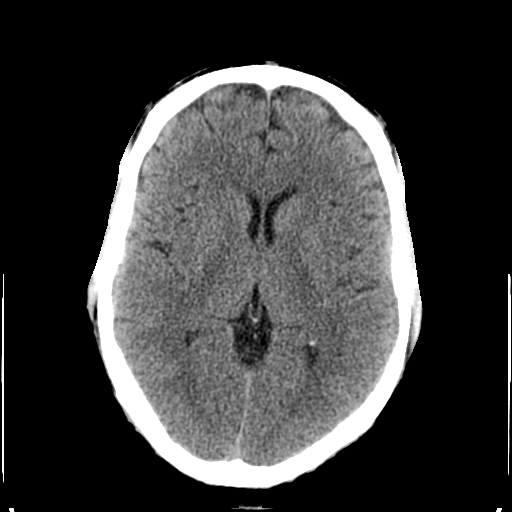
[im 17/36  brain]
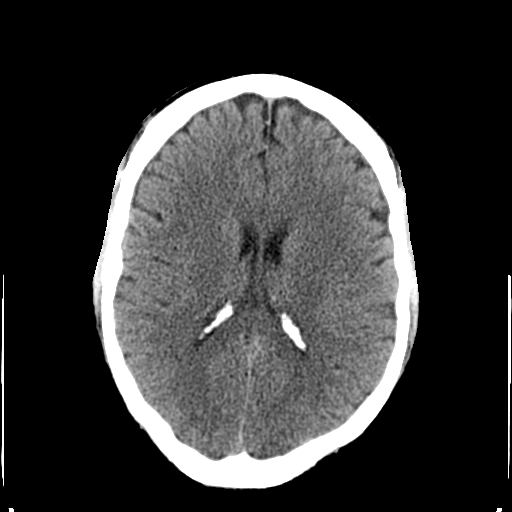
[im 19/36  brain]
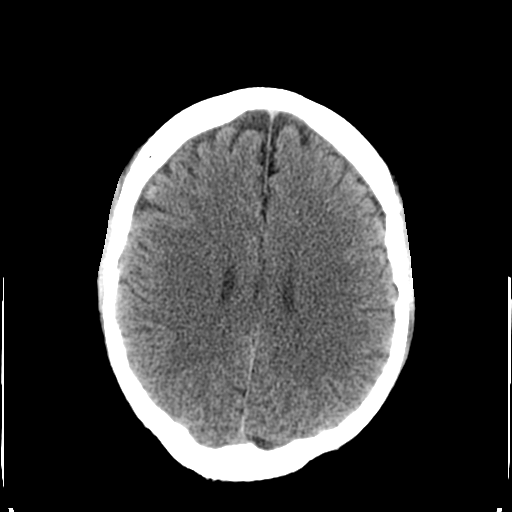
[im 19/36  bone]
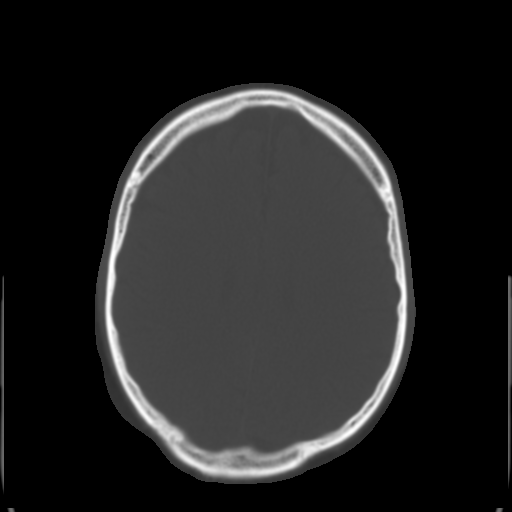
[im 21/36  brain]
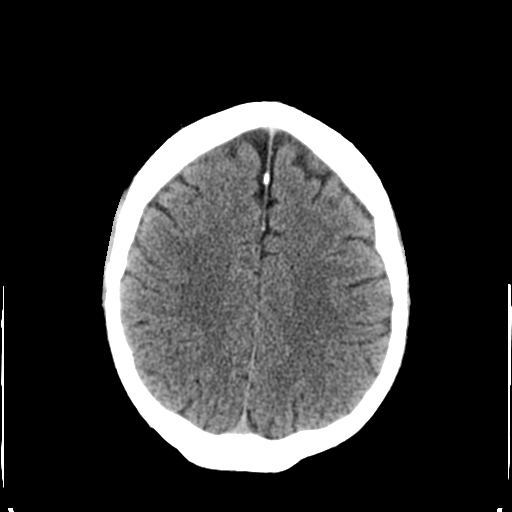
[im 23/36  brain]
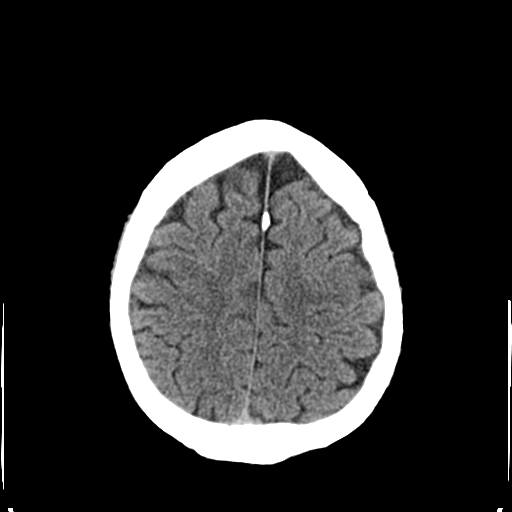
[im 26/36  brain]
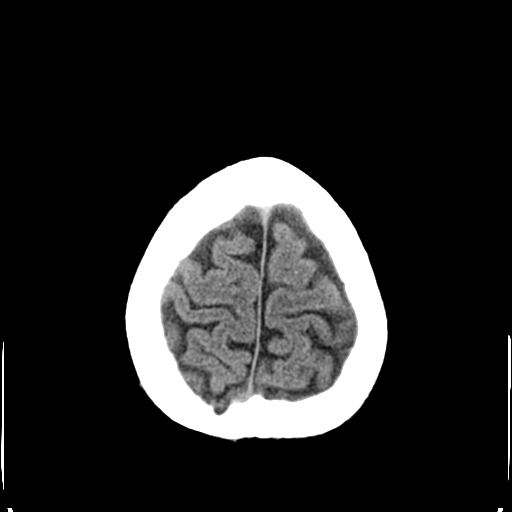
[im 27/36  brain]
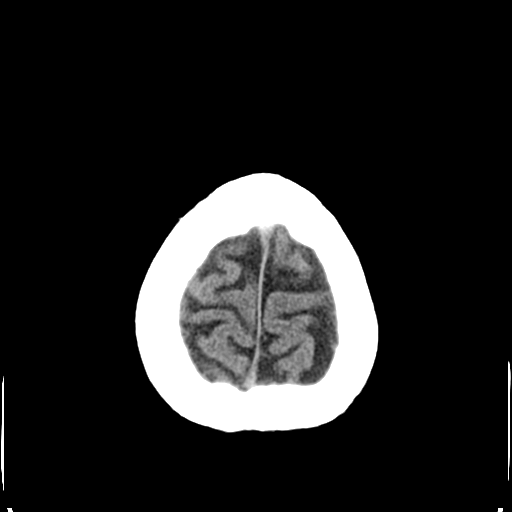
[im 27/36  bone]
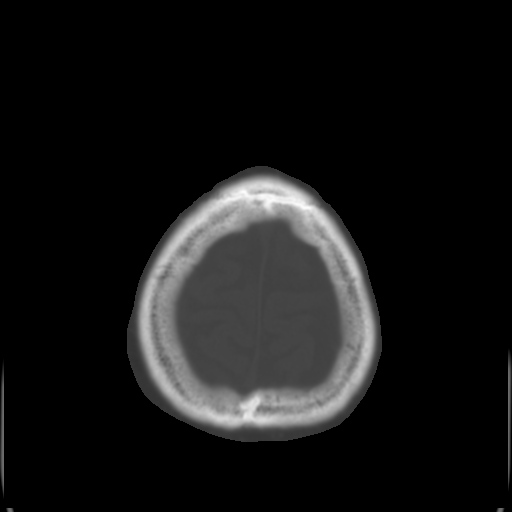
[im 29/36  brain]
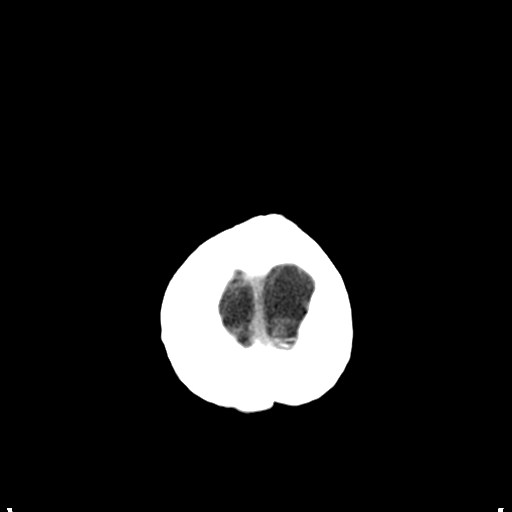
[im 32/36  brain]
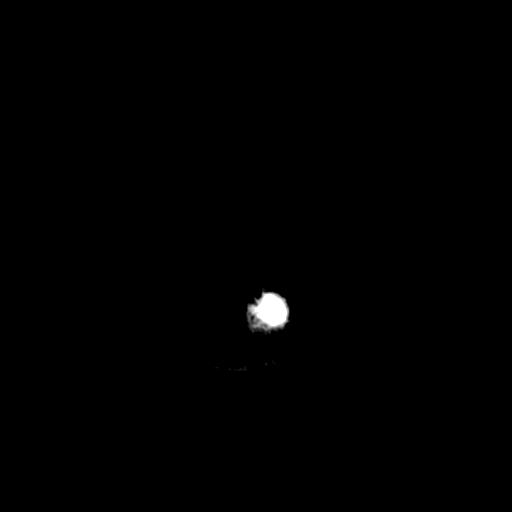
[im 34/36  brain]
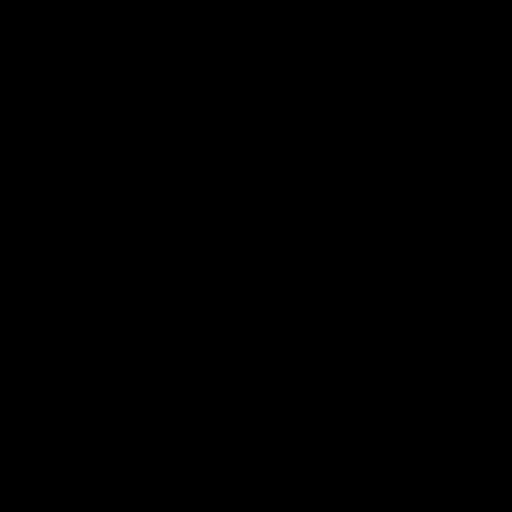

[16 of 30 positions shown; findings below may reference images not displayed]

FINDINGS: The brain has a normal appearance without evidence of
malformation, atrophy, old or acute infarction, mass lesion,
hemorrhage, hydrocephalus or extra-axial collection.  Sinuses,
middle ears and mastoids are clear.  No skull or skull base
abnormality.
IMPRESSION: Normal head CT

## 2016-10-10 DIAGNOSIS — R69 Illness, unspecified: Secondary | ICD-10-CM | POA: Diagnosis not present

## 2016-10-10 DIAGNOSIS — F341 Dysthymic disorder: Secondary | ICD-10-CM | POA: Diagnosis not present

## 2017-01-21 DIAGNOSIS — E785 Hyperlipidemia, unspecified: Secondary | ICD-10-CM | POA: Diagnosis not present

## 2017-01-21 DIAGNOSIS — Z9660 Presence of unspecified orthopedic joint implant: Secondary | ICD-10-CM | POA: Diagnosis not present

## 2017-01-21 DIAGNOSIS — R0689 Other abnormalities of breathing: Secondary | ICD-10-CM | POA: Diagnosis not present

## 2017-01-21 DIAGNOSIS — Z8673 Personal history of transient ischemic attack (TIA), and cerebral infarction without residual deficits: Secondary | ICD-10-CM | POA: Diagnosis not present

## 2017-01-21 DIAGNOSIS — Z79899 Other long term (current) drug therapy: Secondary | ICD-10-CM | POA: Diagnosis not present

## 2017-01-21 DIAGNOSIS — R079 Chest pain, unspecified: Secondary | ICD-10-CM | POA: Diagnosis not present

## 2017-01-21 DIAGNOSIS — R202 Paresthesia of skin: Secondary | ICD-10-CM | POA: Diagnosis not present

## 2017-01-21 DIAGNOSIS — I10 Essential (primary) hypertension: Secondary | ICD-10-CM | POA: Diagnosis not present

## 2017-04-18 DIAGNOSIS — R69 Illness, unspecified: Secondary | ICD-10-CM | POA: Diagnosis not present

## 2017-04-18 DIAGNOSIS — F341 Dysthymic disorder: Secondary | ICD-10-CM | POA: Diagnosis not present

## 2017-10-17 DIAGNOSIS — F341 Dysthymic disorder: Secondary | ICD-10-CM | POA: Diagnosis not present

## 2017-10-17 DIAGNOSIS — R69 Illness, unspecified: Secondary | ICD-10-CM | POA: Diagnosis not present

## 2018-01-03 DIAGNOSIS — Z791 Long term (current) use of non-steroidal anti-inflammatories (NSAID): Secondary | ICD-10-CM | POA: Diagnosis not present

## 2018-01-03 DIAGNOSIS — Z7722 Contact with and (suspected) exposure to environmental tobacco smoke (acute) (chronic): Secondary | ICD-10-CM | POA: Diagnosis not present

## 2018-01-03 DIAGNOSIS — R69 Illness, unspecified: Secondary | ICD-10-CM | POA: Diagnosis not present

## 2018-01-03 DIAGNOSIS — Z6835 Body mass index (BMI) 35.0-35.9, adult: Secondary | ICD-10-CM | POA: Diagnosis not present

## 2018-01-03 DIAGNOSIS — Z803 Family history of malignant neoplasm of breast: Secondary | ICD-10-CM | POA: Diagnosis not present

## 2018-01-03 DIAGNOSIS — K219 Gastro-esophageal reflux disease without esophagitis: Secondary | ICD-10-CM | POA: Diagnosis not present

## 2018-01-03 DIAGNOSIS — E785 Hyperlipidemia, unspecified: Secondary | ICD-10-CM | POA: Diagnosis not present

## 2018-01-03 DIAGNOSIS — I10 Essential (primary) hypertension: Secondary | ICD-10-CM | POA: Diagnosis not present

## 2018-01-03 DIAGNOSIS — E669 Obesity, unspecified: Secondary | ICD-10-CM | POA: Diagnosis not present

## 2018-01-03 DIAGNOSIS — G8929 Other chronic pain: Secondary | ICD-10-CM | POA: Diagnosis not present

## 2019-06-07 ENCOUNTER — Encounter (HOSPITAL_COMMUNITY): Payer: Self-pay

## 2019-06-07 NOTE — Patient Instructions (Addendum)
DUE TO COVID-19 ONLY ONE VISITOR IS ALLOWED TO COME WITH YOU AND STAY IN THE WAITING ROOM ONLY DURING PRE OP AND PROCEDURE. THE ONE VISITOR MAY VISIT WITH YOU IN YOUR PRIVATE ROOM DURING VISITING HOURS ONLY!!   COVID SWAB TESTING MUST BE COMPLETED ON:  Friday, June 14, 2019 at 1005 AM [address], [place] -[facility] enter pre surgical testing line (Must self quarantine after testing. Follow instructions on handout.)            Your procedure is scheduled on: Tuesday, June 18, 2019   Report to [facility] Main  Entrance    Report to admitting at 8:45 AM   Call this number if you have problems the morning of surgery [phone]   Do not eat food:After Midnight.   May have liquids until 8:15 AM day of surgery   CLEAR LIQUID DIET  Foods Allowed                                                                     Foods Excluded  Water, Black Coffee and tea, regular and decaf                             liquids that you cannot  Plain Jell-O in any flavor  (No red)                                           see through such as: Fruit ices (not with fruit pulp)                                     milk, soups, orange juice  Iced Popsicles (No red)                                    All solid food Carbonated beverages, regular and diet                                    Apple juices Sports drinks like Gatorade (No red) Lightly seasoned clear broth or consume(fat free) Sugar, honey syrup  Sample Menu Breakfast                                Lunch                                     Supper Cranberry juice                    Beef broth                            Chicken broth Jell-O  Grape juice                           Apple juice Coffee or tea                        Jell-O                                      Popsicle                                                Coffee or tea                        Coffee or  tea   Complete one Ensure drink the morning of surgery at 8:15 AM the day of surgery.   Oral Hygiene is also important to reduce your risk of infection.                                    Remember - BRUSH YOUR TEETH THE MORNING OF SURGERY WITH YOUR REGULAR TOOTHPASTE   Do NOT smoke after Midnight   Take these medicines the morning of surgery with A SIP OF WATER: Atorvastatin                               You may not have any metal on your body including jewelry, and body piercings             Do not wear lotions, powders, perfumes/cologne, or deodorant                           Men may shave face and neck.   Do not bring valuables to the hospital. [place].   Contacts, dentures or bridgework may not be worn into surgery.   Bring small overnight bag day of surgery.    Special Instructions: Bring a copy of your healthcare power of attorney and living will documents         the day of surgery if you haven't scanned them in before.              Please read over the following fact sheets you were given:  [facility] - Preparing for Surgery Before surgery, you can play an important role.  Because skin is not sterile, your skin needs to be as free of germs as possible.  You can reduce the number of germs on your skin by washing with CHG (chlorahexidine gluconate) soap before surgery.  CHG is an antiseptic cleaner which kills germs and bonds with the skin to continue killing germs even after washing. Please DO NOT use if you have an allergy to CHG or antibacterial soaps.  If your skin becomes reddened/irritated stop using the CHG and inform your nurse when you arrive at Short Stay. Do not shave (including legs and underarms) for at least 48 hours prior to  the first CHG shower.  You may shave your face/neck.  Please follow these instructions carefully:  1.  Shower with CHG Soap the night before surgery and the  morning of surgery.  2.  If you  choose to wash your hair, wash your hair first as usual with your normal  shampoo.  3.  After you shampoo, rinse your hair and body thoroughly to remove the shampoo.                             4.  Use CHG as you would any other liquid soap.  You can apply chg directly to the skin and wash.  Gently with a scrungie or clean washcloth.  5.  Apply the CHG Soap to your body ONLY FROM THE NECK DOWN.   Do   not use on face/ open                           Wound or open sores. Avoid contact with eyes, ears mouth and   genitals (private parts).                       Wash face,  Genitals (private parts) with your normal soap.             6.  Wash thoroughly, paying special attention to the area where your    surgery  will be performed.  7.  Thoroughly rinse your body with warm water from the neck down.  8.  DO NOT shower/wash with your normal soap after using and rinsing off the CHG Soap.                9.  Pat yourself dry with a clean towel.            10.  Wear clean pajamas.            11.  Place clean sheets on your bed the night of your first shower and do not  sleep with pets. Day of Surgery : Do not apply any lotions/deodorants the morning of surgery.  Please wear clean clothes to the hospital/surgery center.  FAILURE TO FOLLOW THESE INSTRUCTIONS MAY RESULT IN THE CANCELLATION OF YOUR SURGERY  PATIENT SIGNATURE_________________________________  NURSE SIGNATURE__________________________________  ________________________________________________________________________   Rogelia Mire  An incentive spirometer is a tool that can help keep your lungs clear and active. This tool measures how well you are filling your lungs with each breath. Taking long deep breaths may help reverse or decrease the chance of developing breathing (pulmonary) problems (especially infection) following:  A long period of time when you are unable to move or be active. BEFORE THE PROCEDURE   If the spirometer  includes an indicator to show your best effort, your nurse or respiratory therapist will set it to a desired goal.  If possible, sit up straight or lean slightly forward. Try not to slouch.  Hold the incentive spirometer in an upright position. INSTRUCTIONS FOR USE  1. Sit on the edge of your bed if possible, or sit up as far as you can in bed or on a chair. 2. Hold the incentive spirometer in an upright position. 3. Breathe out normally. 4. Place the mouthpiece in your mouth and seal your lips tightly around it. 5. Breathe in slowly and as deeply as possible, raising the piston or the ball toward the top  of the column. 6. Hold your breath for 3-5 seconds or for as long as possible. Allow the piston or ball to fall to the bottom of the column. 7. Remove the mouthpiece from your mouth and breathe out normally. 8. Rest for a few seconds and repeat Steps 1 through 7 at least 10 times every 1-2 hours when you are awake. Take your time and take a few normal breaths between deep breaths. 9. The spirometer may include an indicator to show your best effort. Use the indicator as a goal to work toward during each repetition. 10. After each set of 10 deep breaths, practice coughing to be sure your lungs are clear. If you have an incision (the cut made at the time of surgery), support your incision when coughing by placing a pillow or rolled up towels firmly against it. Once you are able to get out of bed, walk around indoors and cough well. You may stop using the incentive spirometer when instructed by your caregiver.  RISKS AND COMPLICATIONS  Take your time so you do not get dizzy or light-headed.  If you are in pain, you may need to take or ask for pain medication before doing incentive spirometry. It is harder to take a deep breath if you are having pain. AFTER USE  Rest and breathe slowly and easily.  It can be helpful to keep track of a log of your progress. Your caregiver can provide you with a  simple table to help with this. If you are using the spirometer at home, follow these instructions: SEEK MEDICAL CARE IF:   You are having difficultly using the spirometer.  You have trouble using the spirometer as often as instructed.  Your pain medication is not giving enough relief while using the spirometer.  You develop fever of 100.5 F (38.1 C) or higher. SEEK IMMEDIATE MEDICAL CARE IF:   You cough up bloody sputum that had not been present before.  You develop fever of 102 F (38.9 C) or greater.  You develop worsening pain at or near the incision site. MAKE SURE YOU:   Understand these instructions.  Will watch your condition.  Will get help right away if you are not doing well or get worse. Document Released: 08/01/2006 Document Revised: 06/13/2011 Document Reviewed: 10/02/2006 ExitCare Patient Information 2014 ExitCare, [place].   ________________________________________________________________________  WHAT IS A BLOOD TRANSFUSION? Blood Transfusion Information  A transfusion is the replacement of blood or some of its parts. Blood is made up of multiple cells which provide different functions.  Red blood cells carry oxygen and are used for blood loss replacement.  White blood cells fight against infection.  Platelets control bleeding.  Plasma helps clot blood.  Other blood products are available for specialized needs, such as hemophilia or other clotting disorders. BEFORE THE TRANSFUSION  Who gives blood for transfusions?   Healthy volunteers who are fully evaluated to make sure their blood is safe. This is blood bank blood. Transfusion therapy is the safest it has ever been in the practice of medicine. Before blood is taken from a donor, a complete history is taken to make sure that person has no history of diseases nor engages in risky social behavior (examples are intravenous drug use or sexual activity with multiple partners). The donor's travel history  is screened to minimize risk of transmitting infections, such as malaria. The donated blood is tested for signs of infectious diseases, such as HIV and hepatitis. The blood is then tested to  be sure it is compatible with you in order to minimize the chance of a transfusion reaction. If you or a relative donates blood, this is often done in anticipation of surgery and is not appropriate for emergency situations. It takes many days to process the donated blood. RISKS AND COMPLICATIONS Although transfusion therapy is very safe and saves many lives, the main dangers of transfusion include:   Getting an infectious disease.  Developing a transfusion reaction. This is an allergic reaction to something in the blood you were given. Every precaution is taken to prevent this. The decision to have a blood transfusion has been considered carefully by your caregiver before blood is given. Blood is not given unless the benefits outweigh the risks. AFTER THE TRANSFUSION  Right after receiving a blood transfusion, you will usually feel much better and more energetic. This is especially true if your red blood cells have gotten low (anemic). The transfusion raises the level of the red blood cells which carry oxygen, and this usually causes an energy increase.  The nurse administering the transfusion will monitor you carefully for complications. HOME CARE INSTRUCTIONS  No special instructions are needed after a transfusion. You may find your energy is better. Speak with your caregiver about any limitations on activity for underlying diseases you may have. SEEK MEDICAL CARE IF:   Your condition is not improving after your transfusion.  You develop redness or irritation at the intravenous (IV) site. SEEK IMMEDIATE MEDICAL CARE IF:  Any of the following symptoms occur over the next 12 hours:  Shaking chills.  You have a temperature by mouth above 102 F (38.9 C), not controlled by medicine.  Chest, back, or  muscle pain.  People around you feel you are not acting correctly or are confused.  Shortness of breath or difficulty breathing.  Dizziness and fainting.  You get a rash or develop hives.  You have a decrease in urine output.  Your urine turns a dark color or changes to pink, red, or brown. Any of the following symptoms occur over the next 10 days:  You have a temperature by mouth above 102 F (38.9 C), not controlled by medicine.  Shortness of breath.  Weakness after normal activity.  The white part of the eye turns yellow (jaundice).  You have a decrease in the amount of urine or are urinating less often.  Your urine turns a dark color or changes to pink, red, or brown. Document Released: 03/18/2000 Document Revised: 06/13/2011 Document Reviewed: 11/05/2007 [facility] Patient Information 2014 [place], [place].  _______________________________________________________________________

## 2019-06-10 ENCOUNTER — Other Ambulatory Visit: Payer: Self-pay

## 2019-06-10 ENCOUNTER — Encounter (HOSPITAL_COMMUNITY): Payer: Self-pay

## 2019-06-10 ENCOUNTER — Encounter (HOSPITAL_COMMUNITY)
Admission: RE | Admit: 2019-06-10 | Discharge: 2019-06-10 | Disposition: A | Discharge status: Home / Self Care | Payer: No Typology Code available for payment source | Source: Ambulatory Visit | Attending: Orthopedic Surgery | Admitting: Orthopedic Surgery

## 2019-06-10 DIAGNOSIS — Z01810 Encounter for preprocedural cardiovascular examination: Secondary | ICD-10-CM | POA: Diagnosis not present

## 2019-06-10 DIAGNOSIS — Z01812 Encounter for preprocedural laboratory examination: Secondary | ICD-10-CM | POA: Diagnosis present

## 2019-06-10 HISTORY — DX: Unspecified osteoarthritis, unspecified site: M19.90

## 2019-06-10 HISTORY — DX: Benign prostatic hyperplasia without lower urinary tract symptoms: N40.0

## 2019-06-10 HISTORY — DX: Post-traumatic stress disorder, unspecified: F43.10

## 2019-06-10 HISTORY — DX: Male erectile dysfunction, unspecified: N52.9

## 2019-06-10 LAB — CBC
HCT: 42 % (ref 39.0–52.0)
Hemoglobin: 14.1 g/dL (ref 13.0–17.0)
MCH: 25.6 pg — ABNORMAL LOW (ref 26.0–34.0)
MCHC: 33.6 g/dL (ref 30.0–36.0)
MCV: 76.2 fL — ABNORMAL LOW (ref 80.0–100.0)
Platelets: 183 10*3/uL (ref 150–400)
RBC: 5.51 MIL/uL (ref 4.22–5.81)
RDW: 14 % (ref 11.5–15.5)
WBC: 6.5 10*3/uL (ref 4.0–10.5)
nRBC: 0 % (ref 0.0–0.2)

## 2019-06-10 LAB — ABO/RH: ABO/RH(D): A POS

## 2019-06-10 LAB — BASIC METABOLIC PANEL
Anion gap: 6 (ref 5–15)
BUN: 13 mg/dL (ref 8–23)
CO2: 27 mmol/L (ref 22–32)
Calcium: 8.9 mg/dL (ref 8.9–10.3)
Chloride: 106 mmol/L (ref 98–111)
Creatinine, Ser: 0.93 mg/dL (ref 0.61–1.24)
GFR calc Af Amer: >60 mL/min (ref >60)
GFR calc non Af Amer: >60 mL/min (ref >60)
Glucose, Bld: 101 mg/dL — ABNORMAL HIGH (ref 70–99)
Potassium: 4.6 mmol/L (ref 3.5–5.1)
Sodium: 139 mmol/L (ref 135–145)

## 2019-06-10 LAB — SURGICAL PCR SCREEN
MRSA, PCR: NEGATIVE
Staphylococcus aureus: NEGATIVE

## 2019-06-10 NOTE — Progress Notes (Signed)
PCP - [facility] Cardiologist - [facility]  Chest x-ray - N/A EKG - 06/10/19 in epic Stress Test - greater than 2 years ECHO -N/A  Cardiac Cath -N/A  Sleep Study - N/A CPAP - N/A  Fasting Blood Sugar - N/A Checks Blood Sugar __N/A___ times a day  Blood Thinner Instructions: N/A Aspirin Instructions: N/A Last Dose:N/A  Anesthesia review: N/A  Patient denies shortness of breath, fever, cough and chest pain at PAT appointment   Patient verbalized understanding of instructions that were given to them at the PAT appointment. Patient was also instructed that they will need to review over the PAT instructions again at home before surgery.

## 2019-06-12 NOTE — H&P (Signed)
TOTAL HIP ADMISSION H&P  Patient is admitted for right total hip arthroplasty, anterior approach.  Subjective:  Chief Complaint:  Right hip primary OA / pain  HPI: Adam Randolph, 64 y.o. male, has a history of pain and functional disability in the right hip(s) due to arthritis and patient has failed non-surgical conservative treatments for greater than 12 weeks to include NSAID's and/or analgesics, use of assistive devices and activity modification.  Onset of symptoms was gradual starting 4+ years ago with gradually worsening course since that time.The patient noted no past surgery on the right hip(s).  Patient currently rates pain in the right hip at 10 out of 10 with activity. Patient has night pain, worsening of pain with activity and weight bearing, trendelenberg gait, pain that interfers with activities of daily living and pain with passive range of motion. Patient has evidence of periarticular osteophytes and joint space narrowing by imaging studies. This condition presents safety issues increasing the risk of falls.  There is no current active infection.  Risks, benefits and expectations were discussed with the patient.  Risks including but not limited to the risk of anesthesia, blood clots, nerve damage, blood vessel damage, failure of the prosthesis, infection and up to and including death.  Patient understand the risks, benefits and expectations and wishes to proceed with surgery.   PCP: [facility]  D/C Plans:       Home   Post-op Meds:       Meds given so he could get them already from the [place]  Decadron:      Is to be given  FYI:    Xarelto then ASA (previous PE)  Norco   DME:   Rx sent for - RW & 3-n-1  PT:   [facility]  [facility]   Patient Active Problem List   Diagnosis Date Noted  . Fasting hyperglycemia 08/27/2012  . Pulmonary embolism (HCC) 08/25/2012  . GERD (gastroesophageal reflux disease) 05/10/2010  . DUODENAL ULCER, HX OF 04/01/2008  . UNSPECIFIED  LABYRINTHITIS 02/15/2008  . SEBACEOUS CYST 08/29/2007  . Decreased libido 08/29/2007  . HYPERLIPIDEMIA 06/11/2007  . HYPERTENSION 06/11/2007   Past Medical History:  Diagnosis Date  . Abdominal pain, epigastric 02/15/2008  . Arthritis   . BPH (benign prostatic hyperplasia)   . Decreased libido 08/29/2007  . DIZZINESS, CHRONIC 08/19/2008   resolved  . DUODENAL ULCER, HX OF 04/01/2008  . ED (erectile dysfunction)   . History of DVT (deep vein thrombosis) 2014  . History of pulmonary embolus (PE) 08/2012   Small right upper lobe pulmonary embolism  . HYPERLIPIDEMIA 06/11/2007  . HYPERTENSION 06/11/2007  . PTSD (post-traumatic stress disorder)   . UNSPECIFIED LABYRINTHITIS 02/15/2008    Past Surgical History:  Procedure Laterality Date  . APPENDECTOMY    . COLONOSCOPY    . CYSTECTOMY     Right groin  . TOTAL KNEE ARTHROPLASTY Left     No current facility-administered medications for this encounter.   Current Outpatient Medications  Medication Sig Dispense Refill Last Dose  . atorvastatin (LIPITOR) 20 MG tablet Take 20 mg by mouth every evening.      . Carboxymethylcellulose Sodium (THERATEARS) 0.25 % SOLN Place 1 drop into both eyes 2 (two) times daily as needed (dry eyes).     . Cholecalciferol (VITAMIN D) 50 MCG (2000 UT) CAPS Take 2,000 Units by mouth daily.     . fluticasone (FLONASE) 50 MCG/ACT nasal spray Place 2 sprays into both nostrils daily as  needed for allergies or rhinitis.     . hydrochlorothiazide (HYDRODIURIL) 25 MG tablet Take 25 mg by mouth daily.      . Multiple Vitamin (MULTIVITAMIN PO) Take 1 tablet by mouth daily.      . vitamin B-12 (CYANOCOBALAMIN) 1000 MCG tablet Take 1,000 mcg by mouth daily.     . meclizine (ANTIVERT) 25 MG tablet TAKE ONE TABLET BY MOUTH THREE TIMES DAILY AS NEEDED (Patient not taking: Reported on 06/07/2019) 28 tablet 3 Not Taking at Unknown time  . Rivaroxaban (XARELTO) 20 MG TABS Take 1 tablet (20 mg total) by mouth daily. (Patient not  taking: Reported on 06/07/2019) 30 tablet 3 Not Taking at Unknown time   No Known Allergies   Social History   Tobacco Use  . Smoking status: Former Games developer  . Smokeless tobacco: Never Used  . Tobacco comment: quit over 30 years  Substance Use Topics  . Alcohol use: No    Family History  Problem Relation Age of Onset  . Hypertension Father   . Cancer Father      Review of Systems  Constitutional: Negative.   HENT: Negative.   Eyes: Negative.   Respiratory: Negative.   Cardiovascular: Negative.   Gastrointestinal: Negative.   Genitourinary: Negative.   Musculoskeletal: Positive for joint pain.  Skin: Negative.   Neurological: Negative.   Endo/Heme/Allergies: Negative.   Psychiatric/Behavioral: Negative.       Objective:  Physical Exam  Constitutional: He is oriented to person, place, and time. He appears well-developed.  HENT:  Head: Normocephalic.  Eyes: Pupils are equal, round, and reactive to light.  Neck: No JVD present. No tracheal deviation present. No thyromegaly present.  Cardiovascular: Normal rate, regular rhythm and intact distal pulses.  Respiratory: Effort normal and breath sounds normal. No respiratory distress. He has no wheezes.  GI: Soft. There is no abdominal tenderness. There is no guarding.  Musculoskeletal:     Cervical back: Neck supple.     Right hip: Tenderness and bony tenderness present. No swelling, deformity or lacerations. Decreased range of motion. Decreased strength.  Lymphadenopathy:    He has no cervical adenopathy.  Neurological: He is alert and oriented to person, place, and time. A sensory deficit (tingling in left LE) is present.  Skin: Skin is warm and dry.  Psychiatric: He has a normal mood and affect.      Labs:  Estimated body mass index is 33.23 kg/m as calculated from the following:   Height as of 06/10/19: 6' (1.829 m).   Weight as of 06/10/19: 111.1 kg.   Imaging Review Plain radiographs demonstrate severe  degenerative joint disease of the right hip. The bone quality appears to be good for age and reported activity level.      Assessment/Plan:  End stage arthritis, right hip  The patient history, physical examination, clinical judgement of the provider and imaging studies are consistent with end stage degenerative joint disease of the right hip and total hip arthroplasty is deemed medically necessary. The treatment options including medical management, injection therapy, arthroscopy and arthroplasty were discussed at length. The risks and benefits of total hip arthroplasty were presented and reviewed. The risks due to aseptic loosening, infection, stiffness, dislocation/subluxation,  thromboembolic complications and other imponderables were discussed.  The patient acknowledged the explanation, agreed to proceed with the plan and consent was signed. Patient is being admitted for inpatient treatment for surgery, pain control, PT, OT, prophylactic antibiotics, VTE prophylaxis, progressive ambulation and ADL's and discharge planning.The  patient is planning to be discharged home.    West Pugh Alyssamae Klinck   PA-C  06/12/2019, 2:10 PM

## 2019-06-14 ENCOUNTER — Other Ambulatory Visit (HOSPITAL_COMMUNITY)
Admission: RE | Admit: 2019-06-14 | Discharge: 2019-06-14 | Disposition: A | Discharge status: Home / Self Care | Payer: No Typology Code available for payment source | Source: Ambulatory Visit | Attending: Orthopedic Surgery | Admitting: Orthopedic Surgery

## 2019-06-14 DIAGNOSIS — Z20822 Contact with and (suspected) exposure to covid-19: Secondary | ICD-10-CM | POA: Diagnosis not present

## 2019-06-14 DIAGNOSIS — Z01812 Encounter for preprocedural laboratory examination: Secondary | ICD-10-CM | POA: Diagnosis present

## 2019-06-14 LAB — SARS CORONAVIRUS 2 (TAT 6-24 HRS): SARS Coronavirus 2: NEGATIVE

## 2019-06-17 NOTE — Progress Notes (Signed)
Adam Randolph made aware to arrive at 7:30 AM 06/18/19 and drink pre surgical drink at 7:00 AM, he verbalized understanding.

## 2019-06-18 ENCOUNTER — Encounter (HOSPITAL_COMMUNITY): Payer: Self-pay | Admitting: Orthopedic Surgery

## 2019-06-18 ENCOUNTER — Ambulatory Visit (HOSPITAL_COMMUNITY): Payer: No Typology Code available for payment source | Admitting: Anesthesiology

## 2019-06-18 ENCOUNTER — Other Ambulatory Visit: Payer: Self-pay

## 2019-06-18 ENCOUNTER — Ambulatory Visit (HOSPITAL_COMMUNITY): Payer: No Typology Code available for payment source

## 2019-06-18 ENCOUNTER — Observation Stay (HOSPITAL_COMMUNITY)
Admission: RE | Admit: 2019-06-18 | Discharge: 2019-06-19 | Disposition: A | Discharge status: Home / Self Care | Payer: No Typology Code available for payment source | Attending: Orthopedic Surgery | Admitting: Orthopedic Surgery

## 2019-06-18 ENCOUNTER — Encounter (HOSPITAL_COMMUNITY)
Admission: RE | Disposition: A | Discharge status: Home / Self Care | Payer: Self-pay | Source: Home / Self Care | Attending: Orthopedic Surgery

## 2019-06-18 ENCOUNTER — Observation Stay (HOSPITAL_COMMUNITY): Payer: No Typology Code available for payment source

## 2019-06-18 DIAGNOSIS — F419 Anxiety disorder, unspecified: Secondary | ICD-10-CM | POA: Insufficient documentation

## 2019-06-18 DIAGNOSIS — M1611 Unilateral primary osteoarthritis, right hip: Principal | ICD-10-CM | POA: Insufficient documentation

## 2019-06-18 DIAGNOSIS — Z96649 Presence of unspecified artificial hip joint: Secondary | ICD-10-CM

## 2019-06-18 DIAGNOSIS — Z79899 Other long term (current) drug therapy: Secondary | ICD-10-CM | POA: Diagnosis not present

## 2019-06-18 DIAGNOSIS — Z8249 Family history of ischemic heart disease and other diseases of the circulatory system: Secondary | ICD-10-CM | POA: Insufficient documentation

## 2019-06-18 DIAGNOSIS — Z96641 Presence of right artificial hip joint: Secondary | ICD-10-CM

## 2019-06-18 DIAGNOSIS — Z809 Family history of malignant neoplasm, unspecified: Secondary | ICD-10-CM | POA: Diagnosis not present

## 2019-06-18 DIAGNOSIS — E785 Hyperlipidemia, unspecified: Secondary | ICD-10-CM | POA: Insufficient documentation

## 2019-06-18 DIAGNOSIS — K219 Gastro-esophageal reflux disease without esophagitis: Secondary | ICD-10-CM | POA: Diagnosis not present

## 2019-06-18 DIAGNOSIS — I10 Essential (primary) hypertension: Secondary | ICD-10-CM | POA: Diagnosis not present

## 2019-06-18 DIAGNOSIS — Z86718 Personal history of other venous thrombosis and embolism: Secondary | ICD-10-CM | POA: Diagnosis not present

## 2019-06-18 DIAGNOSIS — Z87891 Personal history of nicotine dependence: Secondary | ICD-10-CM | POA: Diagnosis not present

## 2019-06-18 DIAGNOSIS — Z419 Encounter for procedure for purposes other than remedying health state, unspecified: Secondary | ICD-10-CM

## 2019-06-18 HISTORY — PX: TOTAL HIP ARTHROPLASTY: SHX124

## 2019-06-18 LAB — TYPE AND SCREEN
ABO/RH(D): A POS
Antibody Screen: NEGATIVE

## 2019-06-18 SURGERY — ARTHROPLASTY, HIP, TOTAL, ANTERIOR APPROACH
Anesthesia: Spinal | Site: Hip | Laterality: Right

## 2019-06-18 MED ORDER — CEFAZOLIN SODIUM-DEXTROSE 2-4 GM/100ML-% IV SOLN
2.0000 g | INTRAVENOUS | Status: AC
  Administered 2019-06-18: 2 g via INTRAVENOUS
  Filled 2019-06-18: qty 100

## 2019-06-18 MED ORDER — RIVAROXABAN 10 MG PO TABS
10.0000 mg | ORAL_TABLET | ORAL | Status: DC
  Administered 2019-06-19: 10 mg via ORAL
  Filled 2019-06-18: qty 1

## 2019-06-18 MED ORDER — CELECOXIB 200 MG PO CAPS
200.0000 mg | ORAL_CAPSULE | Freq: Two times a day (BID) | ORAL | Status: DC
  Administered 2019-06-18 – 2019-06-19 (×3): 200 mg via ORAL
  Filled 2019-06-18 (×3): qty 1

## 2019-06-18 MED ORDER — ROCURONIUM BROMIDE 10 MG/ML (PF) SYRINGE
PREFILLED_SYRINGE | INTRAVENOUS | Status: AC
  Filled 2019-06-18: qty 10

## 2019-06-18 MED ORDER — METHOCARBAMOL 1000 MG/10ML IJ SOLN
500.0000 mg | Freq: Four times a day (QID) | INTRAVENOUS | Status: DC | PRN
  Filled 2019-06-18: qty 5

## 2019-06-18 MED ORDER — HYDROCODONE-ACETAMINOPHEN 7.5-325 MG PO TABS: 1.0000 | ORAL_TABLET | ORAL | 0 refills | Status: AC | PRN

## 2019-06-18 MED ORDER — FLUTICASONE PROPIONATE 50 MCG/ACT NA SUSP
2.0000 | Freq: Every day | NASAL | Status: DC | PRN
  Filled 2019-06-18: qty 16

## 2019-06-18 MED ORDER — METHOCARBAMOL 500 MG PO TABS
500.0000 mg | ORAL_TABLET | Freq: Four times a day (QID) | ORAL | Status: DC | PRN
  Administered 2019-06-18: 500 mg via ORAL
  Filled 2019-06-18: qty 1

## 2019-06-18 MED ORDER — ATORVASTATIN CALCIUM 20 MG PO TABS
20.0000 mg | ORAL_TABLET | Freq: Every evening | ORAL | Status: DC
  Administered 2019-06-18: 20 mg via ORAL
  Filled 2019-06-18: qty 1

## 2019-06-18 MED ORDER — ACETAMINOPHEN 325 MG PO TABS: 325.0000 mg | ORAL_TABLET | Freq: Four times a day (QID) | ORAL | Status: DC | PRN

## 2019-06-18 MED ORDER — OXYCODONE HCL 5 MG PO TABS: 5.0000 mg | ORAL_TABLET | Freq: Once | ORAL | Status: DC | PRN

## 2019-06-18 MED ORDER — HYDROCODONE-ACETAMINOPHEN 5-325 MG PO TABS
1.0000 | ORAL_TABLET | ORAL | Status: DC | PRN
  Administered 2019-06-18 (×2): 1 via ORAL
  Administered 2019-06-19: 2 via ORAL
  Filled 2019-06-18: qty 2
  Filled 2019-06-18 (×2): qty 1

## 2019-06-18 MED ORDER — LIDOCAINE 2% (20 MG/ML) 5 ML SYRINGE
INTRAMUSCULAR | Status: AC
  Filled 2019-06-18: qty 5

## 2019-06-18 MED ORDER — GLYCOPYRROLATE PF 0.2 MG/ML IJ SOSY
PREFILLED_SYRINGE | INTRAMUSCULAR | Status: AC
  Filled 2019-06-18: qty 1

## 2019-06-18 MED ORDER — LACTATED RINGERS IV BOLUS: 250.0000 mL | Freq: Once | INTRAVENOUS | Status: AC

## 2019-06-18 MED ORDER — FENTANYL CITRATE (PF) 100 MCG/2ML IJ SOLN
INTRAMUSCULAR | Status: DC | PRN
  Administered 2019-06-18: 50 ug via INTRAVENOUS

## 2019-06-18 MED ORDER — METOCLOPRAMIDE HCL 5 MG PO TABS: 5.0000 mg | ORAL_TABLET | Freq: Three times a day (TID) | ORAL | Status: DC | PRN

## 2019-06-18 MED ORDER — DEXAMETHASONE SODIUM PHOSPHATE 10 MG/ML IJ SOLN
INTRAMUSCULAR | Status: DC | PRN
  Administered 2019-06-18: 10 mg via INTRAVENOUS

## 2019-06-18 MED ORDER — CHLORHEXIDINE GLUCONATE 4 % EX LIQD: 60.0000 mL | Freq: Once | CUTANEOUS | Status: DC

## 2019-06-18 MED ORDER — HYDROCHLOROTHIAZIDE 25 MG PO TABS
25.0000 mg | ORAL_TABLET | Freq: Every day | ORAL | Status: DC
  Administered 2019-06-18 – 2019-06-19 (×2): 25 mg via ORAL
  Filled 2019-06-18 (×2): qty 1

## 2019-06-18 MED ORDER — ONDANSETRON HCL 4 MG PO TABS: 4.0000 mg | ORAL_TABLET | Freq: Four times a day (QID) | ORAL | Status: DC | PRN

## 2019-06-18 MED ORDER — METHOCARBAMOL 500 MG PO TABS: 500.0000 mg | ORAL_TABLET | Freq: Four times a day (QID) | ORAL | 0 refills | Status: AC | PRN

## 2019-06-18 MED ORDER — ALUM & MAG HYDROXIDE-SIMETH 200-200-20 MG/5ML PO SUSP: 15.0000 mL | ORAL | Status: DC | PRN

## 2019-06-18 MED ORDER — FERROUS SULFATE 325 (65 FE) MG PO TABS: 325.0000 mg | ORAL_TABLET | Freq: Three times a day (TID) | ORAL | 0 refills | Status: AC

## 2019-06-18 MED ORDER — PHENOL 1.4 % MT LIQD: 1.0000 | OROMUCOSAL | Status: DC | PRN

## 2019-06-18 MED ORDER — FENTANYL CITRATE (PF) 100 MCG/2ML IJ SOLN
INTRAMUSCULAR | Status: AC
  Filled 2019-06-18: qty 2

## 2019-06-18 MED ORDER — MENTHOL 3 MG MT LOZG: 1.0000 | LOZENGE | OROMUCOSAL | Status: DC | PRN

## 2019-06-18 MED ORDER — FENTANYL CITRATE (PF) 100 MCG/2ML IJ SOLN: 25.0000 ug | INTRAMUSCULAR | Status: DC | PRN

## 2019-06-18 MED ORDER — OXYCODONE HCL 5 MG/5ML PO SOLN: 5.0000 mg | Freq: Once | ORAL | Status: DC | PRN

## 2019-06-18 MED ORDER — ONDANSETRON HCL 4 MG/2ML IJ SOLN: 4.0000 mg | Freq: Once | INTRAMUSCULAR | Status: DC | PRN

## 2019-06-18 MED ORDER — POLYETHYLENE GLYCOL 3350 17 G PO PACK: 17.0000 g | PACK | Freq: Two times a day (BID) | ORAL | 0 refills | Status: AC

## 2019-06-18 MED ORDER — PHENYLEPHRINE HCL-NACL 10-0.9 MG/250ML-% IV SOLN
INTRAVENOUS | Status: DC | PRN
  Administered 2019-06-18: 5 ug/min via INTRAVENOUS

## 2019-06-18 MED ORDER — CEFAZOLIN SODIUM-DEXTROSE 2-4 GM/100ML-% IV SOLN
2.0000 g | Freq: Four times a day (QID) | INTRAVENOUS | Status: AC
  Administered 2019-06-18 (×2): 2 g via INTRAVENOUS
  Filled 2019-06-18 (×2): qty 100

## 2019-06-18 MED ORDER — SODIUM CHLORIDE 0.9 % IV SOLN: INTRAVENOUS | Status: DC

## 2019-06-18 MED ORDER — LACTATED RINGERS IV BOLUS
500.0000 mL | Freq: Once | INTRAVENOUS | Status: AC
  Administered 2019-06-18: 500 mL via INTRAVENOUS

## 2019-06-18 MED ORDER — HYDROMORPHONE HCL 1 MG/ML IJ SOLN: 0.5000 mg | INTRAMUSCULAR | Status: DC | PRN

## 2019-06-18 MED ORDER — BUPIVACAINE IN DEXTROSE 0.75-8.25 % IT SOLN
INTRATHECAL | Status: DC | PRN
  Administered 2019-06-18: 2 mL via INTRATHECAL

## 2019-06-18 MED ORDER — ONDANSETRON HCL 4 MG/2ML IJ SOLN: 4.0000 mg | Freq: Four times a day (QID) | INTRAMUSCULAR | Status: DC | PRN

## 2019-06-18 MED ORDER — SODIUM CHLORIDE 0.9 % IR SOLN
Status: DC | PRN
  Administered 2019-06-18: 1000 mL

## 2019-06-18 MED ORDER — BISACODYL 10 MG RE SUPP: 10.0000 mg | Freq: Every day | RECTAL | Status: DC | PRN

## 2019-06-18 MED ORDER — ASPIRIN 81 MG PO CHEW: 81.0000 mg | CHEWABLE_TABLET | Freq: Two times a day (BID) | ORAL | 0 refills | Status: DC

## 2019-06-18 MED ORDER — MIDAZOLAM HCL 2 MG/2ML IJ SOLN
INTRAMUSCULAR | Status: AC
  Filled 2019-06-18: qty 2

## 2019-06-18 MED ORDER — DIPHENHYDRAMINE HCL 12.5 MG/5ML PO ELIX: 12.5000 mg | ORAL_SOLUTION | ORAL | Status: DC | PRN

## 2019-06-18 MED ORDER — RIVAROXABAN 10 MG PO TABS: 10.0000 mg | ORAL_TABLET | Freq: Every day | ORAL | 0 refills | Status: DC

## 2019-06-18 MED ORDER — ONDANSETRON HCL 4 MG/2ML IJ SOLN
INTRAMUSCULAR | Status: DC | PRN
  Administered 2019-06-18: 4 mg via INTRAVENOUS

## 2019-06-18 MED ORDER — MAGNESIUM CITRATE PO SOLN: 1.0000 | Freq: Once | ORAL | Status: DC | PRN

## 2019-06-18 MED ORDER — LACTATED RINGERS IV SOLN: INTRAVENOUS | Status: DC

## 2019-06-18 MED ORDER — MIDAZOLAM HCL 5 MG/5ML IJ SOLN
INTRAMUSCULAR | Status: DC | PRN
  Administered 2019-06-18: 2 mg via INTRAVENOUS

## 2019-06-18 MED ORDER — DOCUSATE SODIUM 100 MG PO CAPS
100.0000 mg | ORAL_CAPSULE | Freq: Two times a day (BID) | ORAL | Status: DC
  Administered 2019-06-18 – 2019-06-19 (×2): 100 mg via ORAL
  Filled 2019-06-18 (×2): qty 1

## 2019-06-18 MED ORDER — DEXAMETHASONE SODIUM PHOSPHATE 10 MG/ML IJ SOLN
10.0000 mg | Freq: Once | INTRAMUSCULAR | Status: AC
  Administered 2019-06-19: 10 mg via INTRAVENOUS
  Filled 2019-06-18: qty 1

## 2019-06-18 MED ORDER — TRANEXAMIC ACID-NACL 1000-0.7 MG/100ML-% IV SOLN
1000.0000 mg | INTRAVENOUS | Status: AC
  Administered 2019-06-18: 1000 mg via INTRAVENOUS
  Filled 2019-06-18: qty 100

## 2019-06-18 MED ORDER — POLYETHYLENE GLYCOL 3350 17 G PO PACK
17.0000 g | PACK | Freq: Two times a day (BID) | ORAL | Status: DC
  Administered 2019-06-19: 17 g via ORAL
  Filled 2019-06-18 (×2): qty 1

## 2019-06-18 MED ORDER — HYDROCODONE-ACETAMINOPHEN 7.5-325 MG PO TABS: 1.0000 | ORAL_TABLET | ORAL | Status: DC | PRN

## 2019-06-18 MED ORDER — DOCUSATE SODIUM 100 MG PO CAPS: 100.0000 mg | ORAL_CAPSULE | Freq: Two times a day (BID) | ORAL | 0 refills | Status: AC

## 2019-06-18 MED ORDER — PROPOFOL 500 MG/50ML IV EMUL
INTRAVENOUS | Status: DC | PRN
  Administered 2019-06-18: 75 ug/kg/min via INTRAVENOUS

## 2019-06-18 MED ORDER — GLYCOPYRROLATE PF 0.2 MG/ML IJ SOSY
PREFILLED_SYRINGE | INTRAMUSCULAR | Status: DC | PRN
  Administered 2019-06-18 (×2): .1 mg via INTRAVENOUS

## 2019-06-18 MED ORDER — METOCLOPRAMIDE HCL 5 MG/ML IJ SOLN: 5.0000 mg | Freq: Three times a day (TID) | INTRAMUSCULAR | Status: DC | PRN

## 2019-06-18 MED ORDER — DEXAMETHASONE SODIUM PHOSPHATE 10 MG/ML IJ SOLN: 10.0000 mg | Freq: Once | INTRAMUSCULAR | Status: DC

## 2019-06-18 MED ORDER — FERROUS SULFATE 325 (65 FE) MG PO TABS
325.0000 mg | ORAL_TABLET | Freq: Three times a day (TID) | ORAL | Status: DC
  Administered 2019-06-19: 325 mg via ORAL
  Filled 2019-06-18: qty 1

## 2019-06-18 MED ORDER — LIDOCAINE 2% (20 MG/ML) 5 ML SYRINGE
INTRAMUSCULAR | Status: DC | PRN
  Administered 2019-06-18: 40 mg via INTRAVENOUS
  Administered 2019-06-18: 60 mg via INTRAVENOUS

## 2019-06-18 SURGICAL SUPPLY — 47 items
BAG DECANTER FOR FLEXI CONT (MISCELLANEOUS) IMPLANT
BAG ZIPLOCK 12X15 (MISCELLANEOUS) IMPLANT
BLADE SAG 18X100X1.27 (BLADE) ×2 IMPLANT
BLADE SURG SZ10 CARB STEEL (BLADE) ×4 IMPLANT
COVER PERINEAL POST (MISCELLANEOUS) ×2 IMPLANT
COVER SURGICAL LIGHT HANDLE (MISCELLANEOUS) ×2 IMPLANT
COVER WAND RF STERILE (DRAPES) IMPLANT
CUP ACET PINNACLE SECTR 56MM (Hips) IMPLANT
DERMABOND ADVANCED (GAUZE/BANDAGES/DRESSINGS) ×1
DERMABOND ADVANCED .7 DNX12 (GAUZE/BANDAGES/DRESSINGS) ×1 IMPLANT
DRAPE STERI IOBAN 125X83 (DRAPES) ×2 IMPLANT
DRAPE U-SHAPE 47X51 STRL (DRAPES) ×4 IMPLANT
DRESSING AQUACEL AG SP 3.5X10 (GAUZE/BANDAGES/DRESSINGS) ×1 IMPLANT
DRSG AQUACEL AG ADV 3.5X10 (GAUZE/BANDAGES/DRESSINGS) ×1 IMPLANT
DRSG AQUACEL AG SP 3.5X10 (GAUZE/BANDAGES/DRESSINGS) ×2
DURAPREP 26ML APPLICATOR (WOUND CARE) ×2 IMPLANT
ELECT REM PT RETURN 15FT ADLT (MISCELLANEOUS) ×2 IMPLANT
ELIMINATOR HOLE APEX DEPUY (Hips) ×1 IMPLANT
GLOVE BIO SURGEON STRL SZ 6 (GLOVE) ×4 IMPLANT
GLOVE BIOGEL PI IND STRL 6.5 (GLOVE) ×1 IMPLANT
GLOVE BIOGEL PI IND STRL 7.5 (GLOVE) ×1 IMPLANT
GLOVE BIOGEL PI IND STRL 8.5 (GLOVE) ×1 IMPLANT
GLOVE BIOGEL PI INDICATOR 6.5 (GLOVE) ×1
GLOVE BIOGEL PI INDICATOR 7.5 (GLOVE) ×1
GLOVE BIOGEL PI INDICATOR 8.5 (GLOVE) ×1
GLOVE ECLIPSE 8.0 STRL XLNG CF (GLOVE) ×4 IMPLANT
GLOVE ORTHO TXT STRL SZ7.5 (GLOVE) ×4 IMPLANT
GOWN STRL REUS W/TWL LRG LVL3 (GOWN DISPOSABLE) ×4 IMPLANT
GOWN STRL REUS W/TWL XL LVL3 (GOWN DISPOSABLE) ×2 IMPLANT
HEAD CERAMIC DELTA 36 PLUS 1.5 (Hips) ×1 IMPLANT
HOLDER FOLEY CATH W/STRAP (MISCELLANEOUS) ×2 IMPLANT
KIT TURNOVER KIT A (KITS) IMPLANT
PACK ANTERIOR HIP CUSTOM (KITS) ×2 IMPLANT
PENCIL SMOKE EVACUATOR (MISCELLANEOUS) IMPLANT
PINNACLE ALTRX PLUS 4 N 36X56 (Hips) ×1 IMPLANT
PINNACLE SECTOR CUP 56MM (Hips) ×2 IMPLANT
SCREW 6.5MMX30MM (Screw) ×1 IMPLANT
STEM FEM ACTIS HIGH SZ8 (Stem) ×1 IMPLANT
SUT MNCRL AB 4-0 PS2 18 (SUTURE) ×2 IMPLANT
SUT STRATAFIX 0 PDS 27 VIOLET (SUTURE) ×2
SUT VIC AB 1 CT1 36 (SUTURE) ×6 IMPLANT
SUT VIC AB 2-0 CT1 27 (SUTURE) ×4
SUT VIC AB 2-0 CT1 TAPERPNT 27 (SUTURE) ×2 IMPLANT
SUTURE STRATFX 0 PDS 27 VIOLET (SUTURE) ×1 IMPLANT
TRAY FOLEY MTR SLVR 16FR STAT (SET/KITS/TRAYS/PACK) IMPLANT
WATER STERILE IRR 1000ML POUR (IV SOLUTION) ×2 IMPLANT
YANKAUER SUCT BULB TIP 10FT TU (MISCELLANEOUS) IMPLANT

## 2019-06-18 NOTE — Transfer of Care (Signed)
Immediate Anesthesia Transfer of Care Note  Patient: Adam Randolph  Procedure(s) Performed: Procedure(s) with comments: TOTAL HIP ARTHROPLASTY ANTERIOR APPROACH (Right) - 70 mins  Patient Location: PACU  Anesthesia Type:Spinal  Level of Consciousness:  sedated, patient cooperative and responds to stimulation  Airway & Oxygen Therapy:Patient Spontanous Breathing and Patient connected to face mask oxgen  Post-op Assessment:  Report given to PACU RN and Post -op Vital signs reviewed and stable  Post vital signs:  Reviewed and stable  Last Vitals:  Vitals:   06/18/19 0749  BP: (!) 143/96  Pulse: 71  Resp: 15  Temp: 36.6 C  SpO2: 97%    Complications: No apparent anesthesia complications

## 2019-06-18 NOTE — Anesthesia Postprocedure Evaluation (Addendum)
Anesthesia Post Note  Patient: Adam Randolph  Procedure(s) Performed: TOTAL HIP ARTHROPLASTY ANTERIOR APPROACH (Right Hip)     Patient location during evaluation: PACU Anesthesia Type: Spinal Level of consciousness: oriented and awake and alert Pain management: pain level controlled Vital Signs Assessment: post-procedure vital signs reviewed and stable Respiratory status: spontaneous breathing, respiratory function stable and nonlabored ventilation Cardiovascular status: blood pressure returned to baseline and stable Postop Assessment: no headache, no backache, no apparent nausea or vomiting, spinal receding and patient able to bend at knees Anesthetic complications: no    Last Vitals:  Vitals:   06/18/19 1215 06/18/19 1230  BP: 121/84 128/84  Pulse: 62 62  Resp: 14 15  Temp:    SpO2: 100% 100%    Last Pain:  Vitals:   06/18/19 1230  TempSrc:   PainSc: 2                  Demontray Franta A.

## 2019-06-18 NOTE — Discharge Instructions (Addendum)
INSTRUCTIONS AFTER JOINT REPLACEMENT  ° °o Remove items at home which could result in a fall. This includes throw rugs or furniture in walking pathways °o ICE to the affected joint every three hours while awake for 30 minutes at a time, for at least the first 3-5 days, and then as needed for pain and swelling.  Continue to use ice for pain and swelling. You may notice swelling that will progress down to the foot and ankle.  This is normal after surgery.  Elevate your leg when you are not up walking on it.   °o Continue to use the breathing machine you got in the hospital (incentive spirometer) which will help keep your temperature down.  It is common for your temperature to cycle up and down following surgery, especially at night when you are not up moving around and exerting yourself.  The breathing machine keeps your lungs expanded and your temperature down. ° ° °DIET:  As you were doing prior to hospitalization, we recommend a well-balanced diet. ° °DRESSING / WOUND CARE / SHOWERING ° °Keep the surgical dressing until follow up.  The dressing is water proof, so you can shower without any extra covering.  IF THE DRESSING FALLS OFF or the wound gets wet inside, change the dressing with sterile gauze.  Please use good hand washing techniques before changing the dressing.  Do not use any lotions or creams on the incision until instructed by your surgeon.   ° °ACTIVITY ° °o Increase activity slowly as tolerated, but follow the weight bearing instructions below.   °o No driving for 6 weeks or until further direction given by your physician.  You cannot drive while taking narcotics.  °o No lifting or carrying greater than 10 lbs. until further directed by your surgeon. °o Avoid periods of inactivity such as sitting longer than an hour when not asleep. This helps prevent blood clots.  °o You may return to work once you are authorized by your doctor.  ° ° ° °WEIGHT BEARING  ° °Weight bearing as tolerated with assist  device (walker, cane, etc) as directed, use it as long as suggested by your surgeon or therapist, typically at least 4-6 weeks. ° ° °EXERCISES ° °Results after joint replacement surgery are often greatly improved when you follow the exercise, range of motion and muscle strengthening exercises prescribed by your doctor. Safety measures are also important to protect the joint from further injury. Any time any of these exercises cause you to have increased pain or swelling, decrease what you are doing until you are comfortable again and then slowly increase them. If you have problems or questions, call your caregiver or physical therapist for advice.  ° °Rehabilitation is important following a joint replacement. After just a few days of immobilization, the muscles of the leg can become weakened and shrink (atrophy).  These exercises are designed to build up the tone and strength of the thigh and leg muscles and to improve motion. Often times heat used for twenty to thirty minutes before working out will loosen up your tissues and help with improving the range of motion but do not use heat for the first two weeks following surgery (sometimes heat can increase post-operative swelling).  ° °These exercises can be done on a training (exercise) mat, on the floor, on a table or on a bed. Use whatever works the best and is most comfortable for you.    Use music or television while you are exercising so that   the exercises are a pleasant break in your day. This will make your life better with the exercises acting as a break in your routine that you can look forward to.   Perform all exercises about fifteen times, three times per day or as directed.  You should exercise both the operative leg and the other leg as well. ° °Exercises include: °  °• Quad Sets - Tighten up the muscle on the front of the thigh (Quad) and hold for 5-10 seconds.   °• Straight Leg Raises - With your knee straight (if you were given a brace, keep it on),  lift the leg to 60 degrees, hold for 3 seconds, and slowly lower the leg.  Perform this exercise against resistance later as your leg gets stronger.  °• Leg Slides: Lying on your back, slowly slide your foot toward your buttocks, bending your knee up off the floor (only go as far as is comfortable). Then slowly slide your foot back down until your leg is flat on the floor again.  °• Angel Wings: Lying on your back spread your legs to the side as far apart as you can without causing discomfort.  °• Hamstring Strength:  Lying on your back, push your heel against the floor with your leg straight by tightening up the muscles of your buttocks.  Repeat, but this time bend your knee to a comfortable angle, and push your heel against the floor.  You may put a pillow under the heel to make it more comfortable if necessary.  ° °A rehabilitation program following joint replacement surgery can speed recovery and prevent re-injury in the future due to weakened muscles. Contact your doctor or a physical therapist for more information on knee rehabilitation.  ° ° °CONSTIPATION ° °Constipation is defined medically as fewer than three stools per week and severe constipation as less than one stool per week.  Even if you have a regular bowel pattern at home, your normal regimen is likely to be disrupted due to multiple reasons following surgery.  Combination of anesthesia, postoperative narcotics, change in appetite and fluid intake all can affect your bowels.  ° °YOU MUST use at least one of the following options; they are listed in order of increasing strength to get the job done.  They are all available over the counter, and you may need to use some, POSSIBLY even all of these options:   ° °Drink plenty of fluids (prune juice may be helpful) and high fiber foods °Colace 100 mg by mouth twice a day  °Senokot for constipation as directed and as needed Dulcolax (bisacodyl), take with full glass of water  °Miralax (polyethylene glycol)  once or twice a day as needed. ° °If you have tried all these things and are unable to have a bowel movement in the first 3-4 days after surgery call either your surgeon or your primary doctor.   ° °If you experience loose stools or diarrhea, hold the medications until you stool forms back up.  If your symptoms do not get better within 1 week or if they get worse, check with your doctor.  If you experience "the worst abdominal pain ever" or develop nausea or vomiting, please contact the office immediately for further recommendations for treatment. ° ° °ITCHING:  If you experience itching with your medications, try taking only a single pain pill, or even half a pain pill at a time.  You can also use Benadryl over the counter for itching or also to   help with sleep.  ° °TED HOSE STOCKINGS:  Use stockings on both legs until for at least 2 weeks or as directed by physician office. They may be removed at night for sleeping. ° °MEDICATIONS:  See your medication summary on the “After Visit Summary” that nursing will review with you.  You may have some home medications which will be placed on hold until you complete the course of blood thinner medication.  It is important for you to complete the blood thinner medication as prescribed. ° °PRECAUTIONS:  If you experience chest pain or shortness of breath - call 911 immediately for transfer to the hospital emergency department.  ° °If you develop a fever greater that 101 F, purulent drainage from wound, increased redness or drainage from wound, foul odor from the wound/dressing, or calf pain - CONTACT YOUR SURGEON.   °                                                °FOLLOW-UP APPOINTMENTS:  If you do not already have a post-op appointment, please call the office for an appointment to be seen by your surgeon.  Guidelines for how soon to be seen are listed in your “After Visit Summary”, but are typically between 1-4 weeks after surgery. ° °OTHER INSTRUCTIONS:  ° °Knee  Replacement:  Do not place pillow under knee, focus on keeping the knee straight while resting.  ° ° °DENTAL ANTIBIOTICS: ° °In most cases prophylactic antibiotics for Dental procdeures after total joint surgery are not necessary. ° °Exceptions are as follows: ° °1. History of prior total joint infection ° °2. Severely immunocompromised (Organ Transplant, cancer chemotherapy, Rheumatoid biologic °meds such as Humera) ° °3. Poorly controlled diabetes (A1C &gt; 8.0, blood glucose over 200) ° °If you have one of these conditions, contact your surgeon for an antibiotic prescription, prior to your °dental procedure. ° ° °MAKE SURE YOU:  °• Understand these instructions.  °• Get help right away if you are not doing well or get worse.  ° ° °Thank you for letting us be a part of your medical care team.  It is a privilege we respect greatly.  We hope these instructions will help you stay on track for a fast and full recovery!  °  °______________________________________________________________________________________________________________________________ ° °Information on my medicine - XARELTO® (Rivaroxaban) ° ° °Why was Xarelto® prescribed for you? °Xarelto® was prescribed for you to reduce the risk of blood clots forming after orthopedic surgery. The medical term for these abnormal blood clots is venous thromboembolism (VTE). ° °What do you need to know about xarelto® ? °Take your Xarelto® ONCE DAILY at the same time every day. °You may take it either with or without food. ° °If you have difficulty swallowing the tablet whole, you may crush it and mix in applesauce just prior to taking your dose. ° °Take Xarelto® exactly as prescribed by your doctor and DO NOT stop taking Xarelto® without talking to the doctor who prescribed the medication.  Stopping without other VTE prevention medication to take the place of Xarelto® may increase your risk of developing a clot. ° °After discharge, you should have regular check-up  appointments with your healthcare provider that is prescribing your Xarelto®.   ° °What do you do if you miss a dose? °If you miss a dose, take it as soon as you remember   on the same day then continue your regularly scheduled once daily regimen the next day. Do not take two doses of Xarelto® on the same day.  ° °Important Safety Information °A possible side effect of Xarelto® is bleeding. You should call your healthcare provider right away if you experience any of the following: °? Bleeding from an injury or your nose that does not stop. °? Unusual colored urine (red or dark brown) or unusual colored stools (red or black). °? Unusual bruising for unknown reasons. °? A serious fall or if you hit your head (even if there is no bleeding). ° °Some medicines may interact with Xarelto® and might increase your risk of bleeding while on Xarelto®. To help avoid this, consult your healthcare provider or pharmacist prior to using any new prescription or non-prescription medications, including herbals, vitamins, non-steroidal anti-inflammatory drugs (NSAIDs) and supplements. ° °This website has more information on Xarelto®: www.xarelto.com. ° ° °

## 2019-06-18 NOTE — Anesthesia Procedure Notes (Signed)
Spinal  Patient location during procedure: OR Start time: 06/18/2019 9:50 AM End time: 06/18/2019 9:53 AM Staffing Performed: anesthesiologist  Anesthesiologist: Mal Amabile, MD Preanesthetic Checklist Completed: patient identified, IV checked, site marked, risks and benefits discussed, surgical consent, monitors and equipment checked, pre-op evaluation and timeout performed Spinal Block Patient position: sitting Prep: DuraPrep and site prepped and draped Patient monitoring: heart rate, cardiac monitor, continuous pulse ox and blood pressure Approach: midline Location: L3-4 Injection technique: single-shot Needle Needle type: Sprotte  Needle gauge: 24 G Needle length: 9 cm Needle insertion depth: 7 cm Assessment Sensory level: T4 Additional Notes Patient tolerated procedure well. Adequate sensory level.

## 2019-06-18 NOTE — Op Note (Signed)
NAME:  Adam Randolph                ACCOUNT NO.: [id number]      MEDICAL RECORD NO.: [id number]      FACILITY:  [facility]      PHYSICIAN:  Shelda Pal  DATE OF BIRTH:  May 01, 1955     DATE OF PROCEDURE:  06/18/2019                                 OPERATIVE REPORT         PREOPERATIVE DIAGNOSIS: Right  hip osteoarthritis.      POSTOPERATIVE DIAGNOSIS:  Right hip osteoarthritis.      PROCEDURE:  Right total hip replacement through an anterior approach   utilizing DePuy THR system, component size 56 mm pinnacle cup, a size 36+4 neutral   Altrex liner, a size 8 Hi Actis stem with a 36+1.5 delta ceramic   ball.      SURGEON:  Madlyn Frankel. Charlann Boxer, M.D.      ASSISTANT:  Dennie Bible, PA-C     ANESTHESIA:  Spinal.      SPECIMENS:  None.      COMPLICATIONS:  None.      BLOOD LOSS:  400 cc     DRAINS:  None.      INDICATION OF THE PROCEDURE:  Adam Randolph is a 65 y.o. male who had   presented to office for evaluation of right hip pain.  Radiographs revealed   progressive degenerative changes with bone-on-bone   articulation of the  hip joint, including subchondral cystic changes and osteophytes.  The patient had painful limited range of   motion significantly affecting their overall quality of life and function.  The patient was failing to    respond to conservative measures including medications and/or injections and activity modification and at this point was ready   to proceed with more definitive measures.  Consent was obtained for   benefit of pain relief.  Specific risks of infection, DVT, component   failure, dislocation, neurovascular injury, and need for revision surgery were reviewed in the office as well discussion of   the anterior versus posterior approach were reviewed.     PROCEDURE IN DETAIL:  The patient was brought to operative theater.   Once adequate anesthesia, preoperative antibiotics, 2 gm of Ancef, 1 gm of Tranexamic Acid, and  10 mg of Decadron were administered, the patient was positioned supine on the [employer] table.  Once the patient was safely positioned with adequate padding of boney prominences we predraped out the hip, and used fluoroscopy to confirm orientation of the pelvis.      The right hip was then prepped and draped from proximal iliac crest to   mid thigh with a shower curtain technique.      Time-out was performed identifying the patient, planned procedure, and the appropriate extremity.     An incision was then made 2 cm lateral to the   anterior superior iliac spine extending over the orientation of the   tensor fascia lata muscle and sharp dissection was carried down to the   fascia of the muscle.      The fascia was then incised.  The muscle belly was identified and swept   laterally and retractor placed along the superior neck.  Following   cauterization of the circumflex vessels and removing some  pericapsular   fat, a second cobra retractor was placed on the inferior neck.  A T-capsulotomy was made along the line of the   superior neck to the trochanteric fossa, then extended proximally and   distally.  Tag sutures were placed and the retractors were then placed   intracapsular.  We then identified the trochanteric fossa and   orientation of my neck cut and then made a neck osteotomy with the femur on traction.  The femoral   head was removed without difficulty or complication.  Traction was let   off and retractors were placed posterior and anterior around the   acetabulum.      The labrum and foveal tissue were debrided.  I began reaming with a 45 mm   reamer and reamed up to 55 mm reamer with good bony bed preparation and a 56 mm  cup was chosen.  The final 56 mm Pinnacle cup was then impacted under fluoroscopy to confirm the depth of penetration and orientation with respect to   Abduction and forward flexion.  A screw was placed into the ilium followed by the hole eliminator.  The  final   36+4 neutral Altrex liner was impacted with good visualized rim fit.  The cup was positioned anatomically within the acetabular portion of the pelvis.      At this point, the femur was rolled to 100 degrees.  Further capsule was   released off the inferior aspect of the femoral neck.  I then   released the superior capsule proximally.  With the leg in a neutral position the hook was placed laterally   along the femur under the vastus lateralis origin and elevated manually and then held in position using the hook attachment on the bed.  The leg was then extended and adducted with the leg rolled to 100   degrees of external rotation.  Retractors were placed along the medial calcar and posteriorly over the greater trochanter.  Once the proximal femur was fully   exposed, I used a box osteotome to set orientation.  I then began   broaching with the starting chili pepper broach and passed this by hand and then broached up to 8.  With the 8 broach in place I chose a high offset neck and did several trial reductions.  The offset was appropriate, leg lengths   appeared to be equal best matched with the +1.5 head ball trial confirmed radiographically.   Given these findings, I went ahead and dislocated the hip, repositioned all   retractors and positioned the right hip in the extended and abducted position.  The final 8 Hi Actis stem was   chosen and it was impacted down to the level of neck cut.  Based on this   and the trial reductions, a final 36+1.5 delta ceramic ball was chosen and   impacted onto a clean and dry trunnion, and the hip was reduced.  The   hip had been irrigated throughout the case again at this point.  I did   reapproximate the superior capsular leaflet to the anterior leaflet   using #1 Vicryl.  The fascia of the   tensor fascia lata muscle was then reapproximated using #1 Vicryl and #0 Stratafix sutures.  The   remaining wound was closed with 2-0 Vicryl and running 4-0  Monocryl.   The hip was cleaned, dried, and dressed sterilely using Dermabond and   Aquacel dressing.  The patient was then brought   to  recovery room in stable condition tolerating the procedure well.    Dennie Bible, PA-C was present for the entirety of the case involved from   preoperative positioning, perioperative retractor management, general   facilitation of the case, as well as primary wound closure as assistant.            Madlyn Frankel Charlann Boxer, M.D.        06/18/2019 9:00 AM

## 2019-06-18 NOTE — Evaluation (Signed)
Physical Therapy Evaluation Patient Details Name: Adam Randolph MRN: [id number] DOB: 1955/10/20 Today's Date: 06/18/2019   History of Present Illness  Patient is 64 y.o.o male s/p Rt THA anterior approach on 06/18/19 with PMH significant for HTN, HLD, hx of DVT, PTSD, OA, Lt TKA.     Clinical Impression  Adam Randolph is a 64 y.o. male POD 0 s/p Rt THA. Patient reports independence with mobility at baseline. Patient is now limited by functional impairments (see PT problem list below) and requires min assist for transfers with RW. Patient was limited by proximal hip weakness and numbness in LE's. He was able to complete stand step/povot transfer with min assist to steady. Patient instructed in exercise to facilitate circulation. Patient will benefit from continued skilled PT interventions to address impairments and progress towards PLOF. Acute PT will follow to progress mobility and stair training in preparation for safe discharge home.     Follow Up Recommendations Follow surgeon's recommendation for DC plan and follow-up therapies    Equipment Recommendations  None recommended by PT    Recommendations for Other Services       Precautions / Restrictions Precautions Precautions: Fall Restrictions Weight Bearing Restrictions: No      Mobility  Bed Mobility Overal bed mobility: Needs Assistance Bed Mobility: Supine to Sit     Supine to sit: Min assist;HOB elevated     General bed mobility comments: cues for use of bed rail, assist to mobilize Rt LE and pivot to EOB  Transfers Overall transfer level: Needs assistance Equipment used: Rolling walker (2 wheeled) Transfers: Sit to/from [employer] Sit to Stand: Min assist;From elevated surface Stand pivot transfers: Min assist;From elevated surface       General transfer comment: cues for safe hand placement/technique for power up and assist to rise fully from EOB. pt unsteady in standing and min assist  required to complete stand step/pivot to recliner and manage RW. verbal cues needed for sequencing step pattern and RW position.  Ambulation/Gait       Stairs     Wheelchair Mobility    Modified Rankin (Stroke Patients Only)       Balance Overall balance assessment: Needs assistance Sitting-balance support: Feet supported Sitting balance-Leahy Scale: Good     Standing balance support: During functional activity;Bilateral upper extremity supported Standing balance-Leahy Scale: Poor          Pertinent Vitals/Pain Pain Assessment: 0-10 Pain Score: 8  Pain Location: Rt hip Pain Descriptors / Indicators: Aching Pain Intervention(s): Limited activity within patient's tolerance;Monitored during session;Ice applied;Repositioned    Home Living Family/patient expects to be discharged to:: Private residence Living Arrangements: Spouse/significant other Available Help at Discharge: Family;Available 24 hours/day Type of Home: House Home Access: Stairs to enter Entrance Stairs-Rails: None Entrance Stairs-Number of Steps: 1 Home Layout: One level Home Equipment: Grab bars - tub/shower;Hand held shower head;Bedside commode;Walker - 2 wheels;Cane - single point      Prior Function Level of Independence: Independent with assistive device(s)         Comments: pt has been using SPC intermittently for gait     Hand Dominance   Dominant Hand: Right    Extremity/Trunk Assessment   Upper Extremity Assessment Upper Extremity Assessment: Overall WFL for tasks assessed    Lower Extremity Assessment Lower Extremity Assessment: RLE deficits/detail RLE Deficits / Details: good quad activation, 4/5 for quad strength, pt with proximal weakness and unsteady with standing RLE Sensation: (pt reports parasthsia/numbness along Rt posterior  thigh)    Cervical / Trunk Assessment Cervical / Trunk Assessment: Normal  Communication   Communication: No difficulties  Cognition  Arousal/Alertness: Awake/alert Behavior During Therapy: WFL for tasks assessed/performed Overall Cognitive Status: Within Functional Limits for tasks assessed           General Comments      Exercises Total Joint Exercises Ankle Circles/Pumps: Both;15 reps;AROM;Seated   Assessment/Plan    PT Assessment Patient needs continued PT services  PT Problem List Decreased strength;Decreased range of motion;Decreased activity tolerance;Decreased balance;Decreased mobility;Decreased knowledge of use of DME;Pain       PT Treatment Interventions DME instruction;Gait training;Functional mobility training;Stair training;Therapeutic activities;Therapeutic exercise;Balance training;Patient/family education    PT Goals (Current goals can be found in the Care Plan section)  Acute Rehab PT Goals Patient Stated Goal: to not hurt while walking PT Goal Formulation: With patient Time For Goal Achievement: 06/25/19 Potential to Achieve Goals: Good    Frequency 7X/week    AM-PAC PT "6 Clicks" Mobility  Outcome Measure Help needed turning from your back to your side while in a flat bed without using bedrails?: A Little Help needed moving from lying on your back to sitting on the side of a flat bed without using bedrails?: A Little Help needed moving to and from a bed to a chair (including a wheelchair)?: A Little Help needed standing up from a chair using your arms (e.g., wheelchair or bedside chair)?: A Little Help needed to walk in hospital room?: A Little Help needed climbing 3-5 steps with a railing? : A Little 6 Click Score: 18    End of Session Equipment Utilized During Treatment: Gait belt Activity Tolerance: No increased pain Patient left: in chair;with call bell/phone within reach;with chair alarm set;with family/visitor present Nurse Communication: Mobility status(pian level) PT Visit Diagnosis: Muscle weakness (generalized) (M62.81);Difficulty in walking, not elsewhere classified  (R26.2)    Time: 6948-5462 PT Time Calculation (min) (ACUTE ONLY): 29 min   Charges:   PT Evaluation $PT Eval Low Complexity: 1 Low PT Treatments $Therapeutic Activity: 8-22 mins       Verner Mould, DPT Physical Therapist with [facility] [phone]  06/18/2019 5:28 PM

## 2019-06-18 NOTE — Anesthesia Preprocedure Evaluation (Addendum)
Anesthesia Evaluation  Patient identified by MRN, date of birth, ID band Patient awake    Reviewed: Allergy & Precautions, NPO status , Patient's Chart, lab work & pertinent test results  Airway Mallampati: II  TM Distance: >3 FB Neck ROM: Full    Dental  (+) Teeth Intact, Caps   Pulmonary former smoker, PE Hx/o PTE 2014   Pulmonary exam normal breath sounds clear to auscultation       Cardiovascular hypertension, Pt. on medications + DVT  Normal cardiovascular exam Rhythm:Regular Rate:Normal  Hx/o DVT 2014   Neuro/Psych PSYCHIATRIC DISORDERS Anxiety negative neurological ROS     GI/Hepatic Neg liver ROS, GERD  Medicated and Controlled,  Endo/Other  Hyperlipidemia  Renal/GU negative Renal ROS   ED BPH    Musculoskeletal  (+) Arthritis , Osteoarthritis,  OA right hip   Abdominal   Peds  Hematology negative hematology ROS (+)   Anesthesia Other Findings   Reproductive/Obstetrics                            Anesthesia Physical Anesthesia Plan  ASA: II  Anesthesia Plan: Spinal   Post-op Pain Management:    Induction:   PONV Risk Score and Plan: 2 and Ondansetron, Midazolam, Treatment may vary due to age or medical condition and Propofol infusion  Airway Management Planned: Natural Airway and Simple Face Mask  Additional Equipment:   Intra-op Plan:   Post-operative Plan: Extubation in OR  Informed Consent: I have reviewed the patients History and Physical, chart, labs and discussed the procedure including the risks, benefits and alternatives for the proposed anesthesia with the patient or authorized representative who has indicated his/her understanding and acceptance.     Dental advisory given  Plan Discussed with: CRNA and Anesthesiologist  Anesthesia Plan Comments:         Anesthesia Quick Evaluation

## 2019-06-18 NOTE — Interval H&P Note (Signed)
History and Physical Interval Note:  06/18/2019 8:53 AM  Adam Randolph  has presented today for surgery, with the diagnosis of Right hip osteoarthrits.  The various methods of treatment have been discussed with the patient and family. After consideration of risks, benefits and other options for treatment, the patient has consented to  Procedure(s) with comments: TOTAL HIP ARTHROPLASTY ANTERIOR APPROACH (Right) - 70 mins as a surgical intervention.  The patient's history has been reviewed, patient examined, no change in status, stable for surgery.  I have reviewed the patient's chart and labs.  Questions were answered to the patient's satisfaction.     Shelda Pal

## 2019-06-19 ENCOUNTER — Encounter (HOSPITAL_COMMUNITY): Payer: Self-pay | Admitting: Orthopedic Surgery

## 2019-06-19 DIAGNOSIS — M1611 Unilateral primary osteoarthritis, right hip: Secondary | ICD-10-CM | POA: Diagnosis not present

## 2019-06-19 LAB — BASIC METABOLIC PANEL
Anion gap: 9 (ref 5–15)
BUN: 17 mg/dL (ref 8–23)
CO2: 25 mmol/L (ref 22–32)
Calcium: 8.7 mg/dL — ABNORMAL LOW (ref 8.9–10.3)
Chloride: 103 mmol/L (ref 98–111)
Creatinine, Ser: 1.06 mg/dL (ref 0.61–1.24)
GFR calc Af Amer: >60 mL/min (ref >60)
GFR calc non Af Amer: >60 mL/min (ref >60)
Glucose, Bld: 133 mg/dL — ABNORMAL HIGH (ref 70–99)
Potassium: 3.9 mmol/L (ref 3.5–5.1)
Sodium: 137 mmol/L (ref 135–145)

## 2019-06-19 LAB — CBC
HCT: 37.1 % — ABNORMAL LOW (ref 39.0–52.0)
Hemoglobin: 12.6 g/dL — ABNORMAL LOW (ref 13.0–17.0)
MCH: 25.5 pg — ABNORMAL LOW (ref 26.0–34.0)
MCHC: 34 g/dL (ref 30.0–36.0)
MCV: 75.1 fL — ABNORMAL LOW (ref 80.0–100.0)
Platelets: 176 10*3/uL (ref 150–400)
RBC: 4.94 MIL/uL (ref 4.22–5.81)
RDW: 13.8 % (ref 11.5–15.5)
WBC: 14.1 10*3/uL — ABNORMAL HIGH (ref 4.0–10.5)
nRBC: 0 % (ref 0.0–0.2)

## 2019-06-19 MED ORDER — RIVAROXABAN 10 MG PO TABS: 10.0000 mg | ORAL_TABLET | Freq: Every day | ORAL | 0 refills | Status: AC

## 2019-06-19 MED ORDER — ASPIRIN 81 MG PO CHEW: 81.0000 mg | CHEWABLE_TABLET | Freq: Two times a day (BID) | ORAL | 0 refills | Status: AC

## 2019-06-19 NOTE — Progress Notes (Addendum)
Physical Therapy Treatment Patient Details Name: Adam Randolph MRN: [id number] DOB: December 21, 1955 Today's Date: 06/19/2019    History of Present Illness Patient is 64 y.o.o male s/p Rt THA anterior approach on 06/18/19 with PMH significant for HTN, HLD, hx of DVT, PTSD, OA, Lt TKA.     PT Comments    POD # 1 Assisted with amb an increased distance in hallway.  Then returned to room to perform some TE's following HEP handout.  Instructed on proper tech, freq as well as use of ICE.   Addressed all mobility questions, discussed appropriate activity, educated on use of ICE.  Pt ready for D/C to home.    Follow Up Recommendations  Follow surgeon's recommendation for DC plan and follow-up therapies     Equipment Recommendations  None recommended by PT    Recommendations for Other Services       Precautions / Restrictions Precautions Precautions: Fall Restrictions Weight Bearing Restrictions: No RLE Weight Bearing: Weight bearing as tolerated    Mobility  Bed Mobility               General bed mobility comments: OOB in recliner  Transfers Overall transfer level: Needs assistance Equipment used: Rolling walker (2 wheeled) Transfers: Sit to/from [employer] Transfers Sit to Stand: Modified independent (Device/Increase time) Stand pivot transfers: Modified independent (Device/Increase time)       Ambulation/Gait Ambulation/Gait assistance: Modified independent (Device/Increase time) Gait Distance (Feet): 55 Feet Assistive device: Rolling walker (2 wheeled) Gait Pattern/deviations: Step-through pattern Gait velocity: WNL   General Gait Details: tolerated a functional distance   Stairs  Wheelchair Mobility    Modified Rankin (Stroke Patients Only)       Balance                                            Cognition Arousal/Alertness: Awake/alert Behavior During Therapy: WFL for tasks assessed/performed Overall Cognitive Status:  Within Functional Limits for tasks assessed                                       Exercises   Total Hip Replacement TE's 10 reps B LE ankle pumps 10 reps towel squeezes 10 reps knee presses 10 reps heel slides  10 reps SAQ's 10 reps ABD Followed by ICE     General Comments        Pertinent Vitals/Pain Pain Assessment: 0-10 Pain Score: 3  Pain Location: Rt hip Pain Descriptors / Indicators: Aching;Tender Pain Intervention(s): Premedicated before session;Repositioned;Ice applied    Home Living                      Prior Function            PT Goals (current goals can now be found in the care plan section) Progress towards PT goals: Progressing toward goals    Frequency    7X/week      PT Plan Current plan remains appropriate    Co-evaluation              AM-PAC PT "6 Clicks" Mobility   Outcome Measure  Help needed turning from your back to your side while in a flat bed without using bedrails?: None Help needed moving from lying on your back to sitting on  the side of a flat bed without using bedrails?: None Help needed moving to and from a bed to a chair (including a wheelchair)?: None Help needed standing up from a chair using your arms (e.g., wheelchair or bedside chair)?: None Help needed to walk in hospital room?: None Help needed climbing 3-5 steps with a railing? : A Little 6 Click Score: 23    End of Session Equipment Utilized During Treatment: Gait belt Activity Tolerance: Patient tolerated treatment well Patient left: in chair;with chair alarm set;with family/visitor present;with call bell/phone within reach Nurse Communication: Mobility status(pt ready for D/C to home after one session) PT Visit Diagnosis: Muscle weakness (generalized) (M62.81);Difficulty in walking, not elsewhere classified (R26.2)     Time: 3244-0102 PT Time Calculation (min) (ACUTE ONLY): 38 min  Charges:  $Gait Training: 8-22  mins $Therapeutic Exercise: 8-22 mins $Therapeutic Activity: 8-22 mins                     Felecia Shelling  PTA Acute  Rehabilitation Services Pager      [phone] Office      [phone]

## 2019-06-19 NOTE — Progress Notes (Signed)
     Subjective: 1 Day Post-Op Procedure(s) (LRB): TOTAL HIP ARTHROPLASTY ANTERIOR APPROACH (Right)   Patient reports pain as mild, pain controlled medication.  No reported events throughout the night.  We did discuss the procedure and expectations moving forward.  To this point the patient is excited about the prospect of his hip in looking forward to getting better.  Patient ready to be discharged home, if he does well therapy.   Objective:   VITALS:   Vitals:   06/19/19 0130 06/19/19 0519  BP: (!) 131/96 125/87  Pulse: 91 85  Resp: 16 16  Temp: 98.9 F (37.2 C) 99.1 F (37.3 C)  SpO2: 99% 98%    Dorsiflexion/Plantar flexion intact Incision: dressing C/D/I No cellulitis present Compartment soft  LABS Recent Labs    06/19/19 0359  HGB 12.6*  HCT 37.1*  WBC 14.1*  PLT 176    Recent Labs    06/19/19 0359  NA 137  K 3.9  BUN 17  CREATININE 1.06  GLUCOSE 133*     Assessment/Plan: 1 Day Post-Op Procedure(s) (LRB): TOTAL HIP ARTHROPLASTY ANTERIOR APPROACH (Right) Foley cath DC'd Advance diet Up with therapy D/C IV fluids Discharge home  Follow up in 2 weeks at [facility] Follow up with OLIN,Jessikah Dicker D in 2 weeks.  Contact information:  [facility] [address] [place] [address] [phone]    Obese (BMI 30-39.9) Estimated body mass index is 33.22 kg/m as calculated from the following:   Height as of this encounter: 6' (1.829 m).   Weight as of this encounter: 111.1 kg. Patient also counseled that weight may inhibit the healing process Patient counseled that losing weight will help with future health issues       Lanney Gins PA-C  [facility]  [place] [address], [address], [place], [place] [address] Phone: [phone] [email or web address] Facebook  [employer]

## 2019-06-20 NOTE — Discharge Summary (Signed)
Physician Discharge Summary  Patient ID: Adam Randolph MRN: [id number] DOB/AGE: 62-Feb-1957 62 y.o.  Admit date: 06/18/2019 Discharge date: 06/19/2019   Procedures:  Procedure(s) (LRB): TOTAL HIP ARTHROPLASTY ANTERIOR APPROACH (Right)  Attending Physician:  Dr. Paralee Cancel   Admission Diagnoses:   Right hip primary OA / pain  Discharge Diagnoses:  Principal Problem:   S/P right THA, AA  Past Medical History:  Diagnosis Date  . Abdominal pain, epigastric 02/15/2008  . Arthritis   . BPH (benign prostatic hyperplasia)   . Decreased libido 08/29/2007  . DIZZINESS, CHRONIC 08/19/2008   resolved  . DUODENAL ULCER, HX OF 04/01/2008  . ED (erectile dysfunction)   . History of DVT (deep vein thrombosis) 2014  . History of pulmonary embolus (PE) 08/2012   Small right upper lobe pulmonary embolism  . HYPERLIPIDEMIA 06/11/2007  . HYPERTENSION 06/11/2007  . PTSD (post-traumatic stress disorder)   . UNSPECIFIED LABYRINTHITIS 02/15/2008    HPI:    Adam Randolph, 64 y.o. male, has a history of pain and functional disability in the right hip(s) due to arthritis and patient has failed non-surgical conservative treatments for greater than 12 weeks to include NSAID's and/or analgesics, use of assistive devices and activity modification.  Onset of symptoms was gradual starting 4+ years ago with gradually worsening course since that time.The patient noted no past surgery on the right hip(s).  Patient currently rates pain in the right hip at 10 out of 10 with activity. Patient has night pain, worsening of pain with activity and weight bearing, trendelenberg gait, pain that interfers with activities of daily living and pain with passive range of motion. Patient has evidence of periarticular osteophytes and joint space narrowing by imaging studies. This condition presents safety issues increasing the risk of falls. There is no current active infection.  Risks, benefits and expectations were  discussed with the patient.  Risks including but not limited to the risk of anesthesia, blood clots, nerve damage, blood vessel damage, failure of the prosthesis, infection and up to and including death.  Patient understand the risks, benefits and expectations and wishes to proceed with surgery.   PCP: [facility]   Discharged Condition: good  Hospital Course:  Patient underwent the above stated procedure on 06/18/2019. Patient tolerated the procedure well and brought to the recovery room in good condition and subsequently to the floor.  POD #1 BP: 125/87 ; Pulse: 85 ; Temp: 99.1 F (37.3 C) ; Resp: 16 Patient reports pain as mild, pain controlled medication.  No reported events throughout the night.  We did discuss the procedure and expectations moving forward.  To this point the patient is excited about the prospect of his hip in looking forward to getting better.  Patient ready to be discharged home. Dorsiflexion/plantar flexion intact, incision: dressing C/D/I, no cellulitis present and compartment soft.   LABS  Basename    HGB     12.6  HCT     37.1    Discharge Exam: General appearance: alert, cooperative and no distress Extremities: Homans sign is negative, no sign of DVT, no edema, redness or tenderness in the calves or thighs and no ulcers, gangrene or trophic changes  Disposition: Home with follow up in 2 weeks   Follow-up Information    Paralee Cancel, MD. Schedule an appointment as soon as possible for a visit in 2 weeks.   Specialty: Orthopedic Surgery Contact information: [address] [place] [address] [phone]  Discharge Instructions    Call MD / Call 911   Complete by: As directed    If you experience chest pain or shortness of breath, CALL 911 and be transported to the hospital emergency room.  If you develope a fever above 101 F, pus (white drainage) or increased drainage or redness at the wound, or calf pain,  call your surgeon's office.   Call MD / Call 911   Complete by: As directed    If you experience chest pain or shortness of breath, CALL 911 and be transported to the hospital emergency room.  If you develope a fever above 101 F, pus (white drainage) or increased drainage or redness at the wound, or calf pain, call your surgeon's office.   Change dressing   Complete by: As directed    Maintain surgical dressing until follow up in the clinic. If the edges start to pull up, may reinforce with tape. If the dressing is no longer working, may remove and cover with gauze and tape, but must keep the area dry and clean.  Call with any questions or concerns.   Change dressing   Complete by: As directed    Maintain surgical dressing until follow up in the clinic. If the edges start to pull up, may reinforce with tape. If the dressing is no longer working, may remove and cover with gauze and tape, but must keep the area dry and clean.  Call with any questions or concerns.   Constipation Prevention   Complete by: As directed    Drink plenty of fluids.  Prune juice may be helpful.  You may use a stool softener, such as Colace (over the counter) 100 mg twice a day.  Use MiraLax (over the counter) for constipation as needed.   Constipation Prevention   Complete by: As directed    Drink plenty of fluids.  Prune juice may be helpful.  You may use a stool softener, such as Colace (over the counter) 100 mg twice a day.  Use MiraLax (over the counter) for constipation as needed.   Diet - low sodium heart healthy   Complete by: As directed    Diet - low sodium heart healthy   Complete by: As directed    Discharge instructions   Complete by: As directed    Maintain surgical dressing until follow up in the clinic. If the edges start to pull up, may reinforce with tape. If the dressing is no longer working, may remove and cover with gauze and tape, but must keep the area dry and clean.  Follow up in 2 weeks at  [facility]. Call with any questions or concerns.   Discharge instructions   Complete by: As directed    Maintain surgical dressing until follow up in the clinic. If the edges start to pull up, may reinforce with tape. If the dressing is no longer working, may remove and cover with gauze and tape, but must keep the area dry and clean.  Follow up in 2 weeks at [facility]. Call with any questions or concerns.   Increase activity slowly as tolerated   Complete by: As directed    Weight bearing as tolerated with assist device (walker, cane, etc) as directed, use it as long as suggested by your surgeon or therapist, typically at least 4-6 weeks.   Increase activity slowly as tolerated   Complete by: As directed    Weight bearing as tolerated with assist device (walker, cane, etc) as directed, use  it as long as suggested by your surgeon or therapist, typically at least 4-6 weeks.   TED hose   Complete by: As directed    Use stockings (TED hose) for 2 weeks on both leg(s).  You may remove them at night for sleeping.   TED hose   Complete by: As directed    Use stockings (TED hose) for 2 weeks on both leg(s).  You may remove them at night for sleeping.      Allergies as of 06/19/2019   No Known Allergies     Medication List    STOP taking these medications   atorvastatin 20 MG tablet Commonly known as: LIPITOR   meclizine 25 MG tablet Commonly known as: ANTIVERT     TAKE these medications   aspirin 81 MG chewable tablet Commonly known as: Aspirin Childrens Chew 1 tablet (81 mg total) by mouth 2 (two) times daily. Start the day after finishing Xarelto. Take for 4 weeks, then resume regular dose.   docusate sodium 100 MG capsule Commonly known as: Colace Take 1 capsule (100 mg total) by mouth 2 (two) times daily.   ferrous sulfate 325 (65 FE) MG tablet Commonly known as: FerrouSul Take 1 tablet (325 mg total) by mouth 3 (three) times daily with meals for 14 days.   fluticasone 50  MCG/ACT nasal spray Commonly known as: FLONASE Place 2 sprays into both nostrils daily as needed for allergies or rhinitis.   hydrochlorothiazide 25 MG tablet Commonly known as: HYDRODIURIL Take 25 mg by mouth daily.   HYDROcodone-acetaminophen 7.5-325 MG tablet Commonly known as: Norco Take 1-2 tablets by mouth every 4 (four) hours as needed for moderate pain.   methocarbamol 500 MG tablet Commonly known as: Robaxin Take 1 tablet (500 mg total) by mouth every 6 (six) hours as needed for muscle spasms.   MULTIVITAMIN PO Take 1 tablet by mouth daily.   polyethylene glycol 17 g packet Commonly known as: MIRALAX / GLYCOLAX Take 17 g by mouth 2 (two) times daily.   rivaroxaban 10 MG Tabs tablet Commonly known as: Xarelto Take 1 tablet (10 mg total) by mouth daily. What changed:   medication strength  how much to take   Theratears 0.25 % Soln Generic drug: Carboxymethylcellulose Sodium Place 1 drop into both eyes 2 (two) times daily as needed (dry eyes).   vitamin B-12 1000 MCG tablet Commonly known as: CYANOCOBALAMIN Take 1,000 mcg by mouth daily.   Vitamin D 50 MCG (2000 UT) Caps Take 2,000 Units by mouth daily.            Discharge Care Instructions  (From admission, onward)         Start     Ordered   06/19/19 0000  Change dressing    Comments: Maintain surgical dressing until follow up in the clinic. If the edges start to pull up, may reinforce with tape. If the dressing is no longer working, may remove and cover with gauze and tape, but must keep the area dry and clean.  Call with any questions or concerns.   06/19/19 1213   06/18/19 0000  Change dressing    Comments: Maintain surgical dressing until follow up in the clinic. If the edges start to pull up, may reinforce with tape. If the dressing is no longer working, may remove and cover with gauze and tape, but must keep the area dry and clean.  Call with any questions or concerns.   06/18/19 0818  Signed: Anastasio Auerbach. Naveya Ellerman   PA-C  06/20/2019, 8:47 AM

## 2020-10-30 DIAGNOSIS — M9905 Segmental and somatic dysfunction of pelvic region: Secondary | ICD-10-CM | POA: Diagnosis not present

## 2020-10-30 DIAGNOSIS — M9903 Segmental and somatic dysfunction of lumbar region: Secondary | ICD-10-CM | POA: Diagnosis not present

## 2020-10-30 DIAGNOSIS — M5441 Lumbago with sciatica, right side: Secondary | ICD-10-CM | POA: Diagnosis not present

## 2020-10-30 DIAGNOSIS — M545 Low back pain, unspecified: Secondary | ICD-10-CM | POA: Diagnosis not present

## 2020-11-03 DIAGNOSIS — M5441 Lumbago with sciatica, right side: Secondary | ICD-10-CM | POA: Diagnosis not present

## 2020-11-03 DIAGNOSIS — M9903 Segmental and somatic dysfunction of lumbar region: Secondary | ICD-10-CM | POA: Diagnosis not present

## 2020-11-03 DIAGNOSIS — M545 Low back pain, unspecified: Secondary | ICD-10-CM | POA: Diagnosis not present

## 2020-11-03 DIAGNOSIS — M9905 Segmental and somatic dysfunction of pelvic region: Secondary | ICD-10-CM | POA: Diagnosis not present

## 2020-11-05 DIAGNOSIS — M5441 Lumbago with sciatica, right side: Secondary | ICD-10-CM | POA: Diagnosis not present

## 2020-11-05 DIAGNOSIS — M9903 Segmental and somatic dysfunction of lumbar region: Secondary | ICD-10-CM | POA: Diagnosis not present

## 2020-11-05 DIAGNOSIS — M9905 Segmental and somatic dysfunction of pelvic region: Secondary | ICD-10-CM | POA: Diagnosis not present

## 2020-11-05 DIAGNOSIS — M545 Low back pain, unspecified: Secondary | ICD-10-CM | POA: Diagnosis not present

## 2020-11-10 DIAGNOSIS — M9905 Segmental and somatic dysfunction of pelvic region: Secondary | ICD-10-CM | POA: Diagnosis not present

## 2020-11-10 DIAGNOSIS — M9903 Segmental and somatic dysfunction of lumbar region: Secondary | ICD-10-CM | POA: Diagnosis not present

## 2020-11-10 DIAGNOSIS — M5441 Lumbago with sciatica, right side: Secondary | ICD-10-CM | POA: Diagnosis not present

## 2020-11-10 DIAGNOSIS — M545 Low back pain, unspecified: Secondary | ICD-10-CM | POA: Diagnosis not present

## 2020-11-12 DIAGNOSIS — M5441 Lumbago with sciatica, right side: Secondary | ICD-10-CM | POA: Diagnosis not present

## 2020-11-12 DIAGNOSIS — M9903 Segmental and somatic dysfunction of lumbar region: Secondary | ICD-10-CM | POA: Diagnosis not present

## 2020-11-12 DIAGNOSIS — M545 Low back pain, unspecified: Secondary | ICD-10-CM | POA: Diagnosis not present

## 2020-11-12 DIAGNOSIS — M9905 Segmental and somatic dysfunction of pelvic region: Secondary | ICD-10-CM | POA: Diagnosis not present

## 2020-11-17 DIAGNOSIS — M5441 Lumbago with sciatica, right side: Secondary | ICD-10-CM | POA: Diagnosis not present

## 2020-11-17 DIAGNOSIS — M545 Low back pain, unspecified: Secondary | ICD-10-CM | POA: Diagnosis not present

## 2020-11-17 DIAGNOSIS — M9903 Segmental and somatic dysfunction of lumbar region: Secondary | ICD-10-CM | POA: Diagnosis not present

## 2020-11-17 DIAGNOSIS — M9905 Segmental and somatic dysfunction of pelvic region: Secondary | ICD-10-CM | POA: Diagnosis not present

## 2020-11-19 DIAGNOSIS — M5441 Lumbago with sciatica, right side: Secondary | ICD-10-CM | POA: Diagnosis not present

## 2020-11-19 DIAGNOSIS — M9905 Segmental and somatic dysfunction of pelvic region: Secondary | ICD-10-CM | POA: Diagnosis not present

## 2020-11-19 DIAGNOSIS — M545 Low back pain, unspecified: Secondary | ICD-10-CM | POA: Diagnosis not present

## 2020-11-19 DIAGNOSIS — M9903 Segmental and somatic dysfunction of lumbar region: Secondary | ICD-10-CM | POA: Diagnosis not present

## 2020-12-14 DIAGNOSIS — M9905 Segmental and somatic dysfunction of pelvic region: Secondary | ICD-10-CM | POA: Diagnosis not present

## 2020-12-14 DIAGNOSIS — M545 Low back pain, unspecified: Secondary | ICD-10-CM | POA: Diagnosis not present

## 2020-12-14 DIAGNOSIS — M5441 Lumbago with sciatica, right side: Secondary | ICD-10-CM | POA: Diagnosis not present

## 2020-12-14 DIAGNOSIS — M9903 Segmental and somatic dysfunction of lumbar region: Secondary | ICD-10-CM | POA: Diagnosis not present

## 2020-12-15 DIAGNOSIS — M9903 Segmental and somatic dysfunction of lumbar region: Secondary | ICD-10-CM | POA: Diagnosis not present

## 2020-12-15 DIAGNOSIS — M5441 Lumbago with sciatica, right side: Secondary | ICD-10-CM | POA: Diagnosis not present

## 2020-12-15 DIAGNOSIS — M545 Low back pain, unspecified: Secondary | ICD-10-CM | POA: Diagnosis not present

## 2020-12-15 DIAGNOSIS — M9905 Segmental and somatic dysfunction of pelvic region: Secondary | ICD-10-CM | POA: Diagnosis not present

## 2020-12-22 DIAGNOSIS — M545 Low back pain, unspecified: Secondary | ICD-10-CM | POA: Diagnosis not present

## 2020-12-22 DIAGNOSIS — M9905 Segmental and somatic dysfunction of pelvic region: Secondary | ICD-10-CM | POA: Diagnosis not present

## 2020-12-22 DIAGNOSIS — M9903 Segmental and somatic dysfunction of lumbar region: Secondary | ICD-10-CM | POA: Diagnosis not present

## 2020-12-22 DIAGNOSIS — M5441 Lumbago with sciatica, right side: Secondary | ICD-10-CM | POA: Diagnosis not present

## 2020-12-24 DIAGNOSIS — E785 Hyperlipidemia, unspecified: Secondary | ICD-10-CM | POA: Diagnosis not present

## 2020-12-29 DIAGNOSIS — M5441 Lumbago with sciatica, right side: Secondary | ICD-10-CM | POA: Diagnosis not present

## 2020-12-29 DIAGNOSIS — M9903 Segmental and somatic dysfunction of lumbar region: Secondary | ICD-10-CM | POA: Diagnosis not present

## 2020-12-29 DIAGNOSIS — M9905 Segmental and somatic dysfunction of pelvic region: Secondary | ICD-10-CM | POA: Diagnosis not present

## 2020-12-29 DIAGNOSIS — M545 Low back pain, unspecified: Secondary | ICD-10-CM | POA: Diagnosis not present

## 2020-12-30 DIAGNOSIS — Z0000 Encounter for general adult medical examination without abnormal findings: Secondary | ICD-10-CM | POA: Diagnosis not present

## 2021-01-05 DIAGNOSIS — M5441 Lumbago with sciatica, right side: Secondary | ICD-10-CM | POA: Diagnosis not present

## 2021-01-05 DIAGNOSIS — M9905 Segmental and somatic dysfunction of pelvic region: Secondary | ICD-10-CM | POA: Diagnosis not present

## 2021-01-05 DIAGNOSIS — M545 Low back pain, unspecified: Secondary | ICD-10-CM | POA: Diagnosis not present

## 2021-01-05 DIAGNOSIS — M9903 Segmental and somatic dysfunction of lumbar region: Secondary | ICD-10-CM | POA: Diagnosis not present

## 2021-01-12 DIAGNOSIS — M9903 Segmental and somatic dysfunction of lumbar region: Secondary | ICD-10-CM | POA: Diagnosis not present

## 2021-01-12 DIAGNOSIS — M5441 Lumbago with sciatica, right side: Secondary | ICD-10-CM | POA: Diagnosis not present

## 2021-01-12 DIAGNOSIS — M9905 Segmental and somatic dysfunction of pelvic region: Secondary | ICD-10-CM | POA: Diagnosis not present

## 2021-01-12 DIAGNOSIS — M545 Low back pain, unspecified: Secondary | ICD-10-CM | POA: Diagnosis not present

## 2021-01-19 DIAGNOSIS — M5441 Lumbago with sciatica, right side: Secondary | ICD-10-CM | POA: Diagnosis not present

## 2021-01-19 DIAGNOSIS — M545 Low back pain, unspecified: Secondary | ICD-10-CM | POA: Diagnosis not present

## 2021-01-19 DIAGNOSIS — M9903 Segmental and somatic dysfunction of lumbar region: Secondary | ICD-10-CM | POA: Diagnosis not present

## 2021-01-19 DIAGNOSIS — M9905 Segmental and somatic dysfunction of pelvic region: Secondary | ICD-10-CM | POA: Diagnosis not present

## 2021-03-23 DIAGNOSIS — R7303 Prediabetes: Secondary | ICD-10-CM | POA: Diagnosis not present

## 2021-03-23 DIAGNOSIS — E785 Hyperlipidemia, unspecified: Secondary | ICD-10-CM | POA: Diagnosis not present

## 2021-06-09 DIAGNOSIS — E1169 Type 2 diabetes mellitus with other specified complication: Secondary | ICD-10-CM | POA: Diagnosis not present

## 2021-06-09 DIAGNOSIS — I10 Essential (primary) hypertension: Secondary | ICD-10-CM | POA: Diagnosis not present

## 2021-06-09 DIAGNOSIS — E785 Hyperlipidemia, unspecified: Secondary | ICD-10-CM | POA: Diagnosis not present

## 2022-01-05 DIAGNOSIS — E785 Hyperlipidemia, unspecified: Secondary | ICD-10-CM | POA: Diagnosis not present

## 2022-01-05 DIAGNOSIS — I10 Essential (primary) hypertension: Secondary | ICD-10-CM | POA: Diagnosis not present

## 2022-01-05 DIAGNOSIS — E1169 Type 2 diabetes mellitus with other specified complication: Secondary | ICD-10-CM | POA: Diagnosis not present

## 2022-01-05 DIAGNOSIS — Z0000 Encounter for general adult medical examination without abnormal findings: Secondary | ICD-10-CM | POA: Diagnosis not present

## 2022-05-30 DIAGNOSIS — Z791 Long term (current) use of non-steroidal anti-inflammatories (NSAID): Secondary | ICD-10-CM | POA: Diagnosis not present

## 2022-05-30 DIAGNOSIS — J9601 Acute respiratory failure with hypoxia: Secondary | ICD-10-CM | POA: Diagnosis not present

## 2022-05-30 DIAGNOSIS — K7200 Acute and subacute hepatic failure without coma: Secondary | ICD-10-CM | POA: Diagnosis not present

## 2022-05-30 DIAGNOSIS — I77810 Thoracic aortic ectasia: Secondary | ICD-10-CM | POA: Diagnosis not present

## 2022-05-30 DIAGNOSIS — I5022 Chronic systolic (congestive) heart failure: Secondary | ICD-10-CM | POA: Diagnosis not present

## 2022-05-30 DIAGNOSIS — I4901 Ventricular fibrillation: Secondary | ICD-10-CM | POA: Diagnosis not present

## 2022-05-30 DIAGNOSIS — R072 Precordial pain: Secondary | ICD-10-CM | POA: Diagnosis not present

## 2022-05-30 DIAGNOSIS — I5082 Biventricular heart failure: Secondary | ICD-10-CM | POA: Diagnosis not present

## 2022-05-30 DIAGNOSIS — Z4682 Encounter for fitting and adjustment of non-vascular catheter: Secondary | ICD-10-CM | POA: Diagnosis not present

## 2022-05-30 DIAGNOSIS — R7303 Prediabetes: Secondary | ICD-10-CM | POA: Diagnosis not present

## 2022-05-30 DIAGNOSIS — Z7984 Long term (current) use of oral hypoglycemic drugs: Secondary | ICD-10-CM | POA: Diagnosis not present

## 2022-05-30 DIAGNOSIS — I255 Ischemic cardiomyopathy: Secondary | ICD-10-CM | POA: Diagnosis not present

## 2022-05-30 DIAGNOSIS — I2102 ST elevation (STEMI) myocardial infarction involving left anterior descending coronary artery: Secondary | ICD-10-CM | POA: Diagnosis not present

## 2022-05-30 DIAGNOSIS — I2510 Atherosclerotic heart disease of native coronary artery without angina pectoris: Secondary | ICD-10-CM | POA: Diagnosis not present

## 2022-05-30 DIAGNOSIS — I110 Hypertensive heart disease with heart failure: Secondary | ICD-10-CM | POA: Diagnosis not present

## 2022-05-30 DIAGNOSIS — I213 ST elevation (STEMI) myocardial infarction of unspecified site: Secondary | ICD-10-CM | POA: Diagnosis not present

## 2022-05-30 DIAGNOSIS — E871 Hypo-osmolality and hyponatremia: Secondary | ICD-10-CM | POA: Diagnosis not present

## 2022-05-30 DIAGNOSIS — I462 Cardiac arrest due to underlying cardiac condition: Secondary | ICD-10-CM | POA: Diagnosis not present

## 2022-05-30 DIAGNOSIS — I469 Cardiac arrest, cause unspecified: Secondary | ICD-10-CM | POA: Diagnosis not present

## 2022-05-30 DIAGNOSIS — Z86711 Personal history of pulmonary embolism: Secondary | ICD-10-CM | POA: Diagnosis not present

## 2022-05-30 DIAGNOSIS — I517 Cardiomegaly: Secondary | ICD-10-CM | POA: Diagnosis not present

## 2022-05-30 DIAGNOSIS — I272 Pulmonary hypertension, unspecified: Secondary | ICD-10-CM | POA: Diagnosis not present

## 2022-05-30 DIAGNOSIS — I2109 ST elevation (STEMI) myocardial infarction involving other coronary artery of anterior wall: Secondary | ICD-10-CM | POA: Diagnosis not present

## 2022-05-30 DIAGNOSIS — R079 Chest pain, unspecified: Secondary | ICD-10-CM | POA: Diagnosis not present

## 2022-05-30 DIAGNOSIS — I472 Ventricular tachycardia, unspecified: Secondary | ICD-10-CM | POA: Diagnosis not present

## 2022-05-30 DIAGNOSIS — R918 Other nonspecific abnormal finding of lung field: Secondary | ICD-10-CM | POA: Diagnosis not present

## 2022-05-30 DIAGNOSIS — Z9911 Dependence on respirator [ventilator] status: Secondary | ICD-10-CM | POA: Diagnosis not present

## 2022-05-30 DIAGNOSIS — R0989 Other specified symptoms and signs involving the circulatory and respiratory systems: Secondary | ICD-10-CM | POA: Diagnosis not present

## 2022-05-30 DIAGNOSIS — I359 Nonrheumatic aortic valve disorder, unspecified: Secondary | ICD-10-CM | POA: Diagnosis not present

## 2022-05-30 DIAGNOSIS — N179 Acute kidney failure, unspecified: Secondary | ICD-10-CM | POA: Diagnosis not present

## 2022-05-30 DIAGNOSIS — Z79899 Other long term (current) drug therapy: Secondary | ICD-10-CM | POA: Diagnosis not present

## 2022-05-30 DIAGNOSIS — E785 Hyperlipidemia, unspecified: Secondary | ICD-10-CM | POA: Diagnosis not present

## 2022-05-30 DIAGNOSIS — I4729 Other ventricular tachycardia: Secondary | ICD-10-CM | POA: Diagnosis not present

## 2022-05-31 DIAGNOSIS — J9601 Acute respiratory failure with hypoxia: Secondary | ICD-10-CM | POA: Diagnosis not present

## 2022-05-31 DIAGNOSIS — I2102 ST elevation (STEMI) myocardial infarction involving left anterior descending coronary artery: Secondary | ICD-10-CM | POA: Diagnosis not present

## 2022-05-31 DIAGNOSIS — R7303 Prediabetes: Secondary | ICD-10-CM | POA: Diagnosis not present

## 2022-05-31 DIAGNOSIS — I2109 ST elevation (STEMI) myocardial infarction involving other coronary artery of anterior wall: Secondary | ICD-10-CM | POA: Diagnosis not present

## 2022-05-31 DIAGNOSIS — Z9911 Dependence on respirator [ventilator] status: Secondary | ICD-10-CM | POA: Diagnosis not present

## 2022-05-31 DIAGNOSIS — E871 Hypo-osmolality and hyponatremia: Secondary | ICD-10-CM | POA: Diagnosis not present

## 2022-05-31 DIAGNOSIS — I4901 Ventricular fibrillation: Secondary | ICD-10-CM | POA: Diagnosis not present

## 2022-05-31 DIAGNOSIS — I472 Ventricular tachycardia, unspecified: Secondary | ICD-10-CM | POA: Diagnosis not present

## 2022-05-31 DIAGNOSIS — K7200 Acute and subacute hepatic failure without coma: Secondary | ICD-10-CM | POA: Diagnosis not present

## 2022-06-01 DIAGNOSIS — I2109 ST elevation (STEMI) myocardial infarction involving other coronary artery of anterior wall: Secondary | ICD-10-CM | POA: Diagnosis not present

## 2022-06-01 DIAGNOSIS — I472 Ventricular tachycardia, unspecified: Secondary | ICD-10-CM | POA: Diagnosis not present

## 2022-06-01 DIAGNOSIS — I4901 Ventricular fibrillation: Secondary | ICD-10-CM | POA: Diagnosis not present

## 2022-06-01 DIAGNOSIS — K7200 Acute and subacute hepatic failure without coma: Secondary | ICD-10-CM | POA: Diagnosis not present

## 2022-06-01 DIAGNOSIS — J9601 Acute respiratory failure with hypoxia: Secondary | ICD-10-CM | POA: Diagnosis not present

## 2022-06-01 DIAGNOSIS — R7303 Prediabetes: Secondary | ICD-10-CM | POA: Diagnosis not present

## 2022-06-01 DIAGNOSIS — E871 Hypo-osmolality and hyponatremia: Secondary | ICD-10-CM | POA: Diagnosis not present

## 2022-06-08 DIAGNOSIS — I272 Pulmonary hypertension, unspecified: Secondary | ICD-10-CM | POA: Diagnosis not present

## 2022-06-08 DIAGNOSIS — I110 Hypertensive heart disease with heart failure: Secondary | ICD-10-CM | POA: Diagnosis not present

## 2022-06-08 DIAGNOSIS — Z7984 Long term (current) use of oral hypoglycemic drugs: Secondary | ICD-10-CM | POA: Diagnosis not present

## 2022-06-08 DIAGNOSIS — Z95810 Presence of automatic (implantable) cardiac defibrillator: Secondary | ICD-10-CM | POA: Diagnosis not present

## 2022-06-08 DIAGNOSIS — I213 ST elevation (STEMI) myocardial infarction of unspecified site: Secondary | ICD-10-CM | POA: Diagnosis not present

## 2022-06-08 DIAGNOSIS — I5030 Unspecified diastolic (congestive) heart failure: Secondary | ICD-10-CM | POA: Diagnosis not present

## 2022-06-08 DIAGNOSIS — E785 Hyperlipidemia, unspecified: Secondary | ICD-10-CM | POA: Diagnosis not present

## 2022-06-08 DIAGNOSIS — Z7982 Long term (current) use of aspirin: Secondary | ICD-10-CM | POA: Diagnosis not present

## 2022-06-08 DIAGNOSIS — Z48812 Encounter for surgical aftercare following surgery on the circulatory system: Secondary | ICD-10-CM | POA: Diagnosis not present

## 2022-06-08 DIAGNOSIS — I255 Ischemic cardiomyopathy: Secondary | ICD-10-CM | POA: Diagnosis not present

## 2022-06-08 DIAGNOSIS — I2510 Atherosclerotic heart disease of native coronary artery without angina pectoris: Secondary | ICD-10-CM | POA: Diagnosis not present

## 2022-06-08 DIAGNOSIS — R7303 Prediabetes: Secondary | ICD-10-CM | POA: Diagnosis not present

## 2022-06-09 DIAGNOSIS — I4901 Ventricular fibrillation: Secondary | ICD-10-CM | POA: Diagnosis not present

## 2022-06-09 DIAGNOSIS — I2109 ST elevation (STEMI) myocardial infarction involving other coronary artery of anterior wall: Secondary | ICD-10-CM | POA: Diagnosis not present

## 2022-06-09 DIAGNOSIS — I2510 Atherosclerotic heart disease of native coronary artery without angina pectoris: Secondary | ICD-10-CM | POA: Diagnosis not present

## 2022-06-09 DIAGNOSIS — I255 Ischemic cardiomyopathy: Secondary | ICD-10-CM | POA: Diagnosis not present

## 2022-06-09 DIAGNOSIS — Z955 Presence of coronary angioplasty implant and graft: Secondary | ICD-10-CM | POA: Diagnosis not present

## 2022-06-14 DIAGNOSIS — R072 Precordial pain: Secondary | ICD-10-CM | POA: Diagnosis not present

## 2022-06-14 DIAGNOSIS — I252 Old myocardial infarction: Secondary | ICD-10-CM | POA: Diagnosis not present

## 2022-06-14 DIAGNOSIS — R079 Chest pain, unspecified: Secondary | ICD-10-CM | POA: Diagnosis not present

## 2022-06-14 DIAGNOSIS — R0602 Shortness of breath: Secondary | ICD-10-CM | POA: Diagnosis not present

## 2022-06-14 DIAGNOSIS — R002 Palpitations: Secondary | ICD-10-CM | POA: Diagnosis not present

## 2022-06-14 DIAGNOSIS — I10 Essential (primary) hypertension: Secondary | ICD-10-CM | POA: Diagnosis not present

## 2022-06-15 DIAGNOSIS — I110 Hypertensive heart disease with heart failure: Secondary | ICD-10-CM | POA: Diagnosis not present

## 2022-06-15 DIAGNOSIS — I213 ST elevation (STEMI) myocardial infarction of unspecified site: Secondary | ICD-10-CM | POA: Diagnosis not present

## 2022-06-15 DIAGNOSIS — I2510 Atherosclerotic heart disease of native coronary artery without angina pectoris: Secondary | ICD-10-CM | POA: Diagnosis not present

## 2022-06-15 DIAGNOSIS — I5030 Unspecified diastolic (congestive) heart failure: Secondary | ICD-10-CM | POA: Diagnosis not present

## 2022-06-15 DIAGNOSIS — Z48812 Encounter for surgical aftercare following surgery on the circulatory system: Secondary | ICD-10-CM | POA: Diagnosis not present

## 2022-06-22 DIAGNOSIS — I2510 Atherosclerotic heart disease of native coronary artery without angina pectoris: Secondary | ICD-10-CM | POA: Diagnosis not present

## 2022-06-22 DIAGNOSIS — I110 Hypertensive heart disease with heart failure: Secondary | ICD-10-CM | POA: Diagnosis not present

## 2022-06-22 DIAGNOSIS — I213 ST elevation (STEMI) myocardial infarction of unspecified site: Secondary | ICD-10-CM | POA: Diagnosis not present

## 2022-06-22 DIAGNOSIS — I5030 Unspecified diastolic (congestive) heart failure: Secondary | ICD-10-CM | POA: Diagnosis not present

## 2022-06-22 DIAGNOSIS — Z48812 Encounter for surgical aftercare following surgery on the circulatory system: Secondary | ICD-10-CM | POA: Diagnosis not present

## 2022-06-23 DIAGNOSIS — I5020 Unspecified systolic (congestive) heart failure: Secondary | ICD-10-CM | POA: Diagnosis not present

## 2022-06-27 DIAGNOSIS — E119 Type 2 diabetes mellitus without complications: Secondary | ICD-10-CM | POA: Diagnosis not present

## 2022-06-27 DIAGNOSIS — I509 Heart failure, unspecified: Secondary | ICD-10-CM | POA: Diagnosis not present

## 2022-06-27 DIAGNOSIS — Z09 Encounter for follow-up examination after completed treatment for conditions other than malignant neoplasm: Secondary | ICD-10-CM | POA: Diagnosis not present

## 2022-06-27 DIAGNOSIS — I2510 Atherosclerotic heart disease of native coronary artery without angina pectoris: Secondary | ICD-10-CM | POA: Diagnosis not present

## 2022-06-29 DIAGNOSIS — I5030 Unspecified diastolic (congestive) heart failure: Secondary | ICD-10-CM | POA: Diagnosis not present

## 2022-06-29 DIAGNOSIS — I2510 Atherosclerotic heart disease of native coronary artery without angina pectoris: Secondary | ICD-10-CM | POA: Diagnosis not present

## 2022-06-29 DIAGNOSIS — I110 Hypertensive heart disease with heart failure: Secondary | ICD-10-CM | POA: Diagnosis not present

## 2022-06-29 DIAGNOSIS — Z48812 Encounter for surgical aftercare following surgery on the circulatory system: Secondary | ICD-10-CM | POA: Diagnosis not present

## 2022-06-29 DIAGNOSIS — I213 ST elevation (STEMI) myocardial infarction of unspecified site: Secondary | ICD-10-CM | POA: Diagnosis not present

## 2022-06-30 DIAGNOSIS — I5020 Unspecified systolic (congestive) heart failure: Secondary | ICD-10-CM | POA: Diagnosis not present

## 2022-07-01 DIAGNOSIS — I2109 ST elevation (STEMI) myocardial infarction involving other coronary artery of anterior wall: Secondary | ICD-10-CM | POA: Diagnosis not present

## 2022-07-12 DIAGNOSIS — Z791 Long term (current) use of non-steroidal anti-inflammatories (NSAID): Secondary | ICD-10-CM | POA: Diagnosis not present

## 2022-07-12 DIAGNOSIS — Z7982 Long term (current) use of aspirin: Secondary | ICD-10-CM | POA: Diagnosis not present

## 2022-07-12 DIAGNOSIS — Z7984 Long term (current) use of oral hypoglycemic drugs: Secondary | ICD-10-CM | POA: Diagnosis not present

## 2022-07-12 DIAGNOSIS — Z4502 Encounter for adjustment and management of automatic implantable cardiac defibrillator: Secondary | ICD-10-CM | POA: Diagnosis not present

## 2022-07-12 DIAGNOSIS — Z7901 Long term (current) use of anticoagulants: Secondary | ICD-10-CM | POA: Diagnosis not present

## 2022-07-12 DIAGNOSIS — I10 Essential (primary) hypertension: Secondary | ICD-10-CM | POA: Diagnosis not present

## 2022-07-12 DIAGNOSIS — Z95810 Presence of automatic (implantable) cardiac defibrillator: Secondary | ICD-10-CM | POA: Diagnosis not present

## 2022-07-12 DIAGNOSIS — T82198A Other mechanical complication of other cardiac electronic device, initial encounter: Secondary | ICD-10-CM | POA: Diagnosis not present

## 2022-07-12 DIAGNOSIS — R0602 Shortness of breath: Secondary | ICD-10-CM | POA: Diagnosis not present

## 2022-07-12 DIAGNOSIS — I252 Old myocardial infarction: Secondary | ICD-10-CM | POA: Diagnosis not present

## 2022-07-12 DIAGNOSIS — Z79899 Other long term (current) drug therapy: Secondary | ICD-10-CM | POA: Diagnosis not present

## 2022-07-12 DIAGNOSIS — T829XXA Unspecified complication of cardiac and vascular prosthetic device, implant and graft, initial encounter: Secondary | ICD-10-CM | POA: Diagnosis not present

## 2022-07-12 DIAGNOSIS — Z86711 Personal history of pulmonary embolism: Secondary | ICD-10-CM | POA: Diagnosis not present

## 2022-08-01 DIAGNOSIS — I2109 ST elevation (STEMI) myocardial infarction involving other coronary artery of anterior wall: Secondary | ICD-10-CM | POA: Diagnosis not present

## 2022-08-04 DIAGNOSIS — I2102 ST elevation (STEMI) myocardial infarction involving left anterior descending coronary artery: Secondary | ICD-10-CM | POA: Diagnosis not present

## 2022-08-04 DIAGNOSIS — Z955 Presence of coronary angioplasty implant and graft: Secondary | ICD-10-CM | POA: Diagnosis not present

## 2022-08-08 DIAGNOSIS — I2102 ST elevation (STEMI) myocardial infarction involving left anterior descending coronary artery: Secondary | ICD-10-CM | POA: Diagnosis not present

## 2022-08-08 DIAGNOSIS — Z955 Presence of coronary angioplasty implant and graft: Secondary | ICD-10-CM | POA: Diagnosis not present

## 2022-08-09 DIAGNOSIS — Z955 Presence of coronary angioplasty implant and graft: Secondary | ICD-10-CM | POA: Diagnosis not present

## 2022-08-09 DIAGNOSIS — I2102 ST elevation (STEMI) myocardial infarction involving left anterior descending coronary artery: Secondary | ICD-10-CM | POA: Diagnosis not present

## 2022-08-11 DIAGNOSIS — Z955 Presence of coronary angioplasty implant and graft: Secondary | ICD-10-CM | POA: Diagnosis not present

## 2022-08-11 DIAGNOSIS — I2102 ST elevation (STEMI) myocardial infarction involving left anterior descending coronary artery: Secondary | ICD-10-CM | POA: Diagnosis not present

## 2022-08-15 DIAGNOSIS — I2102 ST elevation (STEMI) myocardial infarction involving left anterior descending coronary artery: Secondary | ICD-10-CM | POA: Diagnosis not present

## 2022-08-15 DIAGNOSIS — Z955 Presence of coronary angioplasty implant and graft: Secondary | ICD-10-CM | POA: Diagnosis not present

## 2022-08-18 DIAGNOSIS — I2102 ST elevation (STEMI) myocardial infarction involving left anterior descending coronary artery: Secondary | ICD-10-CM | POA: Diagnosis not present

## 2022-08-18 DIAGNOSIS — Z955 Presence of coronary angioplasty implant and graft: Secondary | ICD-10-CM | POA: Diagnosis not present

## 2022-08-22 DIAGNOSIS — Z955 Presence of coronary angioplasty implant and graft: Secondary | ICD-10-CM | POA: Diagnosis not present

## 2022-08-22 DIAGNOSIS — I2102 ST elevation (STEMI) myocardial infarction involving left anterior descending coronary artery: Secondary | ICD-10-CM | POA: Diagnosis not present

## 2022-08-23 DIAGNOSIS — I2102 ST elevation (STEMI) myocardial infarction involving left anterior descending coronary artery: Secondary | ICD-10-CM | POA: Diagnosis not present

## 2022-08-23 DIAGNOSIS — Z955 Presence of coronary angioplasty implant and graft: Secondary | ICD-10-CM | POA: Diagnosis not present

## 2022-08-25 DIAGNOSIS — I2102 ST elevation (STEMI) myocardial infarction involving left anterior descending coronary artery: Secondary | ICD-10-CM | POA: Diagnosis not present

## 2022-08-25 DIAGNOSIS — Z955 Presence of coronary angioplasty implant and graft: Secondary | ICD-10-CM | POA: Diagnosis not present

## 2022-08-26 DIAGNOSIS — I77810 Thoracic aortic ectasia: Secondary | ICD-10-CM | POA: Diagnosis not present

## 2022-08-26 DIAGNOSIS — I5020 Unspecified systolic (congestive) heart failure: Secondary | ICD-10-CM | POA: Diagnosis not present

## 2022-08-26 DIAGNOSIS — I255 Ischemic cardiomyopathy: Secondary | ICD-10-CM | POA: Diagnosis not present

## 2022-08-26 DIAGNOSIS — I213 ST elevation (STEMI) myocardial infarction of unspecified site: Secondary | ICD-10-CM | POA: Diagnosis not present

## 2022-08-30 DIAGNOSIS — Z955 Presence of coronary angioplasty implant and graft: Secondary | ICD-10-CM | POA: Diagnosis not present

## 2022-08-30 DIAGNOSIS — I2102 ST elevation (STEMI) myocardial infarction involving left anterior descending coronary artery: Secondary | ICD-10-CM | POA: Diagnosis not present

## 2022-09-01 DIAGNOSIS — Z955 Presence of coronary angioplasty implant and graft: Secondary | ICD-10-CM | POA: Diagnosis not present

## 2022-09-01 DIAGNOSIS — I2102 ST elevation (STEMI) myocardial infarction involving left anterior descending coronary artery: Secondary | ICD-10-CM | POA: Diagnosis not present

## 2022-09-05 DIAGNOSIS — Z955 Presence of coronary angioplasty implant and graft: Secondary | ICD-10-CM | POA: Diagnosis not present

## 2022-09-05 DIAGNOSIS — I2102 ST elevation (STEMI) myocardial infarction involving left anterior descending coronary artery: Secondary | ICD-10-CM | POA: Diagnosis not present

## 2022-09-06 DIAGNOSIS — Z955 Presence of coronary angioplasty implant and graft: Secondary | ICD-10-CM | POA: Diagnosis not present

## 2022-09-06 DIAGNOSIS — I2102 ST elevation (STEMI) myocardial infarction involving left anterior descending coronary artery: Secondary | ICD-10-CM | POA: Diagnosis not present

## 2022-09-08 DIAGNOSIS — Z955 Presence of coronary angioplasty implant and graft: Secondary | ICD-10-CM | POA: Diagnosis not present

## 2022-09-08 DIAGNOSIS — I2102 ST elevation (STEMI) myocardial infarction involving left anterior descending coronary artery: Secondary | ICD-10-CM | POA: Diagnosis not present

## 2022-09-12 DIAGNOSIS — I2102 ST elevation (STEMI) myocardial infarction involving left anterior descending coronary artery: Secondary | ICD-10-CM | POA: Diagnosis not present

## 2022-09-12 DIAGNOSIS — Z955 Presence of coronary angioplasty implant and graft: Secondary | ICD-10-CM | POA: Diagnosis not present

## 2022-09-13 DIAGNOSIS — I2102 ST elevation (STEMI) myocardial infarction involving left anterior descending coronary artery: Secondary | ICD-10-CM | POA: Diagnosis not present

## 2022-09-13 DIAGNOSIS — Z955 Presence of coronary angioplasty implant and graft: Secondary | ICD-10-CM | POA: Diagnosis not present

## 2022-09-15 DIAGNOSIS — Z955 Presence of coronary angioplasty implant and graft: Secondary | ICD-10-CM | POA: Diagnosis not present

## 2022-09-15 DIAGNOSIS — I2102 ST elevation (STEMI) myocardial infarction involving left anterior descending coronary artery: Secondary | ICD-10-CM | POA: Diagnosis not present

## 2022-09-19 DIAGNOSIS — I2102 ST elevation (STEMI) myocardial infarction involving left anterior descending coronary artery: Secondary | ICD-10-CM | POA: Diagnosis not present

## 2022-09-19 DIAGNOSIS — Z955 Presence of coronary angioplasty implant and graft: Secondary | ICD-10-CM | POA: Diagnosis not present

## 2022-09-20 DIAGNOSIS — I2102 ST elevation (STEMI) myocardial infarction involving left anterior descending coronary artery: Secondary | ICD-10-CM | POA: Diagnosis not present

## 2022-09-20 DIAGNOSIS — Z955 Presence of coronary angioplasty implant and graft: Secondary | ICD-10-CM | POA: Diagnosis not present

## 2022-09-22 DIAGNOSIS — Z955 Presence of coronary angioplasty implant and graft: Secondary | ICD-10-CM | POA: Diagnosis not present

## 2022-09-22 DIAGNOSIS — I2102 ST elevation (STEMI) myocardial infarction involving left anterior descending coronary artery: Secondary | ICD-10-CM | POA: Diagnosis not present

## 2022-09-26 DIAGNOSIS — Z955 Presence of coronary angioplasty implant and graft: Secondary | ICD-10-CM | POA: Diagnosis not present

## 2022-09-26 DIAGNOSIS — I2102 ST elevation (STEMI) myocardial infarction involving left anterior descending coronary artery: Secondary | ICD-10-CM | POA: Diagnosis not present

## 2022-09-27 DIAGNOSIS — I2102 ST elevation (STEMI) myocardial infarction involving left anterior descending coronary artery: Secondary | ICD-10-CM | POA: Diagnosis not present

## 2022-09-27 DIAGNOSIS — Z955 Presence of coronary angioplasty implant and graft: Secondary | ICD-10-CM | POA: Diagnosis not present

## 2022-09-30 DIAGNOSIS — I2102 ST elevation (STEMI) myocardial infarction involving left anterior descending coronary artery: Secondary | ICD-10-CM | POA: Diagnosis not present

## 2022-09-30 DIAGNOSIS — Z955 Presence of coronary angioplasty implant and graft: Secondary | ICD-10-CM | POA: Diagnosis not present

## 2022-10-05 DIAGNOSIS — I2102 ST elevation (STEMI) myocardial infarction involving left anterior descending coronary artery: Secondary | ICD-10-CM | POA: Diagnosis not present

## 2022-10-05 DIAGNOSIS — Z955 Presence of coronary angioplasty implant and graft: Secondary | ICD-10-CM | POA: Diagnosis not present

## 2022-10-07 DIAGNOSIS — I2102 ST elevation (STEMI) myocardial infarction involving left anterior descending coronary artery: Secondary | ICD-10-CM | POA: Diagnosis not present

## 2022-10-07 DIAGNOSIS — Z955 Presence of coronary angioplasty implant and graft: Secondary | ICD-10-CM | POA: Diagnosis not present

## 2022-10-12 DIAGNOSIS — Z955 Presence of coronary angioplasty implant and graft: Secondary | ICD-10-CM | POA: Diagnosis not present

## 2022-10-12 DIAGNOSIS — I2102 ST elevation (STEMI) myocardial infarction involving left anterior descending coronary artery: Secondary | ICD-10-CM | POA: Diagnosis not present

## 2022-10-14 DIAGNOSIS — I2102 ST elevation (STEMI) myocardial infarction involving left anterior descending coronary artery: Secondary | ICD-10-CM | POA: Diagnosis not present

## 2022-10-14 DIAGNOSIS — Z955 Presence of coronary angioplasty implant and graft: Secondary | ICD-10-CM | POA: Diagnosis not present

## 2022-10-19 DIAGNOSIS — Z955 Presence of coronary angioplasty implant and graft: Secondary | ICD-10-CM | POA: Diagnosis not present

## 2022-10-19 DIAGNOSIS — I2102 ST elevation (STEMI) myocardial infarction involving left anterior descending coronary artery: Secondary | ICD-10-CM | POA: Diagnosis not present

## 2022-10-21 DIAGNOSIS — Z955 Presence of coronary angioplasty implant and graft: Secondary | ICD-10-CM | POA: Diagnosis not present

## 2022-10-21 DIAGNOSIS — I2102 ST elevation (STEMI) myocardial infarction involving left anterior descending coronary artery: Secondary | ICD-10-CM | POA: Diagnosis not present

## 2022-10-23 DIAGNOSIS — Z79899 Other long term (current) drug therapy: Secondary | ICD-10-CM | POA: Diagnosis not present

## 2022-10-23 DIAGNOSIS — Z7982 Long term (current) use of aspirin: Secondary | ICD-10-CM | POA: Diagnosis not present

## 2022-10-23 DIAGNOSIS — R0789 Other chest pain: Secondary | ICD-10-CM | POA: Diagnosis not present

## 2022-10-23 DIAGNOSIS — R9431 Abnormal electrocardiogram [ECG] [EKG]: Secondary | ICD-10-CM | POA: Diagnosis not present

## 2022-10-23 DIAGNOSIS — I252 Old myocardial infarction: Secondary | ICD-10-CM | POA: Diagnosis not present

## 2022-10-23 DIAGNOSIS — Z86711 Personal history of pulmonary embolism: Secondary | ICD-10-CM | POA: Diagnosis not present

## 2022-10-23 DIAGNOSIS — I440 Atrioventricular block, first degree: Secondary | ICD-10-CM | POA: Diagnosis not present

## 2022-10-23 DIAGNOSIS — R001 Bradycardia, unspecified: Secondary | ICD-10-CM | POA: Diagnosis not present

## 2022-10-23 DIAGNOSIS — I10 Essential (primary) hypertension: Secondary | ICD-10-CM | POA: Diagnosis not present

## 2022-10-23 DIAGNOSIS — R079 Chest pain, unspecified: Secondary | ICD-10-CM | POA: Diagnosis not present

## 2022-10-23 DIAGNOSIS — R1012 Left upper quadrant pain: Secondary | ICD-10-CM | POA: Diagnosis not present

## 2022-10-26 DIAGNOSIS — Z955 Presence of coronary angioplasty implant and graft: Secondary | ICD-10-CM | POA: Diagnosis not present

## 2022-10-26 DIAGNOSIS — I2102 ST elevation (STEMI) myocardial infarction involving left anterior descending coronary artery: Secondary | ICD-10-CM | POA: Diagnosis not present

## 2022-10-28 DIAGNOSIS — I2102 ST elevation (STEMI) myocardial infarction involving left anterior descending coronary artery: Secondary | ICD-10-CM | POA: Diagnosis not present

## 2022-10-28 DIAGNOSIS — Z955 Presence of coronary angioplasty implant and graft: Secondary | ICD-10-CM | POA: Diagnosis not present

## 2022-11-02 DIAGNOSIS — I2102 ST elevation (STEMI) myocardial infarction involving left anterior descending coronary artery: Secondary | ICD-10-CM | POA: Diagnosis not present

## 2022-11-02 DIAGNOSIS — Z955 Presence of coronary angioplasty implant and graft: Secondary | ICD-10-CM | POA: Diagnosis not present

## 2023-01-24 DIAGNOSIS — E119 Type 2 diabetes mellitus without complications: Secondary | ICD-10-CM | POA: Diagnosis not present

## 2023-01-24 DIAGNOSIS — Z23 Encounter for immunization: Secondary | ICD-10-CM | POA: Diagnosis not present

## 2023-01-24 DIAGNOSIS — I10 Essential (primary) hypertension: Secondary | ICD-10-CM | POA: Diagnosis not present

## 2023-01-24 DIAGNOSIS — E785 Hyperlipidemia, unspecified: Secondary | ICD-10-CM | POA: Diagnosis not present

## 2023-01-24 DIAGNOSIS — E1169 Type 2 diabetes mellitus with other specified complication: Secondary | ICD-10-CM | POA: Diagnosis not present

## 2023-01-24 DIAGNOSIS — Z0000 Encounter for general adult medical examination without abnormal findings: Secondary | ICD-10-CM | POA: Diagnosis not present

## 2023-02-14 DIAGNOSIS — I5020 Unspecified systolic (congestive) heart failure: Secondary | ICD-10-CM | POA: Diagnosis not present

## 2023-02-14 DIAGNOSIS — I213 ST elevation (STEMI) myocardial infarction of unspecified site: Secondary | ICD-10-CM | POA: Diagnosis not present

## 2023-02-14 DIAGNOSIS — I2510 Atherosclerotic heart disease of native coronary artery without angina pectoris: Secondary | ICD-10-CM | POA: Diagnosis not present

## 2023-02-14 DIAGNOSIS — I255 Ischemic cardiomyopathy: Secondary | ICD-10-CM | POA: Diagnosis not present

## 2023-03-14 DIAGNOSIS — R9431 Abnormal electrocardiogram [ECG] [EKG]: Secondary | ICD-10-CM | POA: Diagnosis not present

## 2023-03-14 DIAGNOSIS — R110 Nausea: Secondary | ICD-10-CM | POA: Diagnosis not present

## 2023-03-14 DIAGNOSIS — I440 Atrioventricular block, first degree: Secondary | ICD-10-CM | POA: Diagnosis not present

## 2023-03-14 DIAGNOSIS — R0789 Other chest pain: Secondary | ICD-10-CM | POA: Diagnosis not present

## 2023-03-14 DIAGNOSIS — I10 Essential (primary) hypertension: Secondary | ICD-10-CM | POA: Diagnosis not present

## 2023-03-14 DIAGNOSIS — R079 Chest pain, unspecified: Secondary | ICD-10-CM | POA: Diagnosis not present

## 2023-03-14 DIAGNOSIS — Z79899 Other long term (current) drug therapy: Secondary | ICD-10-CM | POA: Diagnosis not present

## 2023-03-15 DIAGNOSIS — R7989 Other specified abnormal findings of blood chemistry: Secondary | ICD-10-CM | POA: Diagnosis not present

## 2023-03-15 DIAGNOSIS — I255 Ischemic cardiomyopathy: Secondary | ICD-10-CM | POA: Diagnosis not present

## 2023-03-15 DIAGNOSIS — I2510 Atherosclerotic heart disease of native coronary artery without angina pectoris: Secondary | ICD-10-CM | POA: Diagnosis not present

## 2023-03-15 DIAGNOSIS — R079 Chest pain, unspecified: Secondary | ICD-10-CM | POA: Diagnosis not present

## 2023-03-15 DIAGNOSIS — Z955 Presence of coronary angioplasty implant and graft: Secondary | ICD-10-CM | POA: Diagnosis not present

## 2023-03-20 DIAGNOSIS — R7989 Other specified abnormal findings of blood chemistry: Secondary | ICD-10-CM | POA: Diagnosis not present

## 2023-05-17 DIAGNOSIS — I2510 Atherosclerotic heart disease of native coronary artery without angina pectoris: Secondary | ICD-10-CM | POA: Diagnosis not present

## 2023-05-17 DIAGNOSIS — I5020 Unspecified systolic (congestive) heart failure: Secondary | ICD-10-CM | POA: Diagnosis not present

## 2023-05-17 DIAGNOSIS — Z955 Presence of coronary angioplasty implant and graft: Secondary | ICD-10-CM | POA: Diagnosis not present

## 2023-05-17 DIAGNOSIS — R7989 Other specified abnormal findings of blood chemistry: Secondary | ICD-10-CM | POA: Diagnosis not present

## 2023-05-17 DIAGNOSIS — I255 Ischemic cardiomyopathy: Secondary | ICD-10-CM | POA: Diagnosis not present

## 2023-07-26 DIAGNOSIS — I2510 Atherosclerotic heart disease of native coronary artery without angina pectoris: Secondary | ICD-10-CM | POA: Diagnosis not present

## 2023-07-26 DIAGNOSIS — E785 Hyperlipidemia, unspecified: Secondary | ICD-10-CM | POA: Diagnosis not present

## 2023-07-26 DIAGNOSIS — E1169 Type 2 diabetes mellitus with other specified complication: Secondary | ICD-10-CM | POA: Diagnosis not present

## 2023-07-26 DIAGNOSIS — I10 Essential (primary) hypertension: Secondary | ICD-10-CM | POA: Diagnosis not present

## 2023-07-26 DIAGNOSIS — I509 Heart failure, unspecified: Secondary | ICD-10-CM | POA: Diagnosis not present

## 2023-11-16 DIAGNOSIS — I2510 Atherosclerotic heart disease of native coronary artery without angina pectoris: Secondary | ICD-10-CM | POA: Diagnosis not present

## 2023-11-16 DIAGNOSIS — I5020 Unspecified systolic (congestive) heart failure: Secondary | ICD-10-CM | POA: Diagnosis not present

## 2023-12-20 DIAGNOSIS — I252 Old myocardial infarction: Secondary | ICD-10-CM | POA: Diagnosis not present

## 2023-12-20 DIAGNOSIS — Z791 Long term (current) use of non-steroidal anti-inflammatories (NSAID): Secondary | ICD-10-CM | POA: Diagnosis not present

## 2023-12-20 DIAGNOSIS — R12 Heartburn: Secondary | ICD-10-CM | POA: Diagnosis not present

## 2023-12-20 DIAGNOSIS — Z9089 Acquired absence of other organs: Secondary | ICD-10-CM | POA: Diagnosis not present

## 2023-12-20 DIAGNOSIS — Z86711 Personal history of pulmonary embolism: Secondary | ICD-10-CM | POA: Diagnosis not present

## 2023-12-20 DIAGNOSIS — Z7984 Long term (current) use of oral hypoglycemic drugs: Secondary | ICD-10-CM | POA: Diagnosis not present

## 2023-12-20 DIAGNOSIS — Z79899 Other long term (current) drug therapy: Secondary | ICD-10-CM | POA: Diagnosis not present

## 2023-12-20 DIAGNOSIS — R0789 Other chest pain: Secondary | ICD-10-CM | POA: Diagnosis not present

## 2023-12-20 DIAGNOSIS — I2510 Atherosclerotic heart disease of native coronary artery without angina pectoris: Secondary | ICD-10-CM | POA: Diagnosis not present

## 2023-12-20 DIAGNOSIS — Z7982 Long term (current) use of aspirin: Secondary | ICD-10-CM | POA: Diagnosis not present

## 2023-12-21 DIAGNOSIS — R0789 Other chest pain: Secondary | ICD-10-CM | POA: Diagnosis not present

## 2024-01-08 DIAGNOSIS — I10 Essential (primary) hypertension: Secondary | ICD-10-CM | POA: Diagnosis not present

## 2024-01-08 DIAGNOSIS — I2510 Atherosclerotic heart disease of native coronary artery without angina pectoris: Secondary | ICD-10-CM | POA: Diagnosis not present

## 2024-01-08 DIAGNOSIS — E1169 Type 2 diabetes mellitus with other specified complication: Secondary | ICD-10-CM | POA: Diagnosis not present

## 2024-01-08 DIAGNOSIS — E785 Hyperlipidemia, unspecified: Secondary | ICD-10-CM | POA: Diagnosis not present

## 2024-01-08 DIAGNOSIS — Z0000 Encounter for general adult medical examination without abnormal findings: Secondary | ICD-10-CM | POA: Diagnosis not present
